# Patient Record
Sex: Female | Born: 1950 | Race: White | Hispanic: No | Marital: Married | Smoking: Never smoker
Health system: Southern US, Community
[De-identification: ages and names within clinical notes are randomized; demographics above are authoritative.]

## PROBLEM LIST (undated history)

## (undated) DIAGNOSIS — R079 Chest pain, unspecified: Secondary | ICD-10-CM

## (undated) DIAGNOSIS — IMO0001 Reserved for inherently not codable concepts without codable children: Secondary | ICD-10-CM

## (undated) DIAGNOSIS — F32A Depression, unspecified: Secondary | ICD-10-CM

## (undated) DIAGNOSIS — F329 Major depressive disorder, single episode, unspecified: Secondary | ICD-10-CM

## (undated) DIAGNOSIS — I10 Essential (primary) hypertension: Secondary | ICD-10-CM

## (undated) HISTORY — DX: Major depressive disorder, single episode, unspecified: F32.9

## (undated) HISTORY — DX: Essential (primary) hypertension: I10

## (undated) HISTORY — DX: Chest pain, unspecified: R07.9

## (undated) HISTORY — DX: Depression, unspecified: F32.A

## (undated) HISTORY — DX: Reserved for inherently not codable concepts without codable children: IMO0001

## (undated) HISTORY — PX: SPINE SURGERY: SHX786

---

## 1998-12-31 ENCOUNTER — Encounter: Payer: Self-pay | Admitting: Internal Medicine

## 1998-12-31 ENCOUNTER — Ambulatory Visit (HOSPITAL_COMMUNITY): Admission: RE | Admit: 1998-12-31 | Discharge: 1998-12-31 | Payer: Self-pay | Admitting: Family Medicine

## 1999-01-16 ENCOUNTER — Encounter: Payer: Self-pay | Admitting: Internal Medicine

## 1999-01-16 ENCOUNTER — Ambulatory Visit (HOSPITAL_COMMUNITY): Admission: RE | Admit: 1999-01-16 | Discharge: 1999-01-16 | Payer: Self-pay | Admitting: Internal Medicine

## 1999-05-17 ENCOUNTER — Other Ambulatory Visit: Admission: RE | Admit: 1999-05-17 | Discharge: 1999-05-17 | Payer: Self-pay | Admitting: Internal Medicine

## 2000-08-03 ENCOUNTER — Other Ambulatory Visit: Admission: RE | Admit: 2000-08-03 | Discharge: 2000-08-03 | Payer: Self-pay | Admitting: Gynecology

## 2000-08-18 ENCOUNTER — Other Ambulatory Visit: Admission: RE | Admit: 2000-08-18 | Discharge: 2000-08-18 | Payer: Self-pay | Admitting: Gynecology

## 2000-08-18 ENCOUNTER — Encounter (INDEPENDENT_AMBULATORY_CARE_PROVIDER_SITE_OTHER): Payer: Self-pay | Admitting: Specialist

## 2001-09-10 ENCOUNTER — Other Ambulatory Visit: Admission: RE | Admit: 2001-09-10 | Discharge: 2001-09-10 | Payer: Self-pay | Admitting: Gynecology

## 2002-01-12 ENCOUNTER — Encounter: Payer: Self-pay | Admitting: Internal Medicine

## 2002-01-12 ENCOUNTER — Ambulatory Visit (HOSPITAL_COMMUNITY): Admission: RE | Admit: 2002-01-12 | Discharge: 2002-01-12 | Payer: Self-pay | Admitting: Internal Medicine

## 2002-09-12 ENCOUNTER — Other Ambulatory Visit: Admission: RE | Admit: 2002-09-12 | Discharge: 2002-09-12 | Payer: Self-pay | Admitting: Gynecology

## 2003-01-23 ENCOUNTER — Encounter: Payer: Self-pay | Admitting: Gynecology

## 2003-01-23 ENCOUNTER — Ambulatory Visit (HOSPITAL_COMMUNITY): Admission: RE | Admit: 2003-01-23 | Discharge: 2003-01-23 | Payer: Self-pay | Admitting: Gynecology

## 2003-09-26 ENCOUNTER — Other Ambulatory Visit: Admission: RE | Admit: 2003-09-26 | Discharge: 2003-09-26 | Payer: Self-pay | Admitting: *Deleted

## 2004-02-01 ENCOUNTER — Ambulatory Visit (HOSPITAL_COMMUNITY): Admission: RE | Admit: 2004-02-01 | Discharge: 2004-02-01 | Payer: Self-pay | Admitting: Gynecology

## 2004-03-14 ENCOUNTER — Inpatient Hospital Stay (HOSPITAL_BASED_OUTPATIENT_CLINIC_OR_DEPARTMENT_OTHER): Admission: RE | Admit: 2004-03-14 | Discharge: 2004-03-14 | Payer: Self-pay | Admitting: Cardiology

## 2004-04-23 ENCOUNTER — Ambulatory Visit (HOSPITAL_COMMUNITY): Admission: RE | Admit: 2004-04-23 | Discharge: 2004-04-23 | Payer: Self-pay | Admitting: Gastroenterology

## 2004-10-02 ENCOUNTER — Other Ambulatory Visit: Admission: RE | Admit: 2004-10-02 | Discharge: 2004-10-02 | Payer: Self-pay | Admitting: Gynecology

## 2005-02-04 ENCOUNTER — Ambulatory Visit (HOSPITAL_COMMUNITY): Admission: RE | Admit: 2005-02-04 | Discharge: 2005-02-04 | Payer: Self-pay | Admitting: Family Medicine

## 2005-02-19 ENCOUNTER — Encounter: Admission: RE | Admit: 2005-02-19 | Discharge: 2005-02-19 | Payer: Self-pay | Admitting: Family Medicine

## 2005-10-06 ENCOUNTER — Other Ambulatory Visit: Admission: RE | Admit: 2005-10-06 | Discharge: 2005-10-06 | Payer: Self-pay | Admitting: Gynecology

## 2006-02-12 ENCOUNTER — Ambulatory Visit (HOSPITAL_COMMUNITY): Admission: RE | Admit: 2006-02-12 | Discharge: 2006-02-12 | Payer: Self-pay | Admitting: Gynecology

## 2006-10-08 ENCOUNTER — Other Ambulatory Visit: Admission: RE | Admit: 2006-10-08 | Discharge: 2006-10-08 | Payer: Self-pay | Admitting: Gynecology

## 2007-02-16 ENCOUNTER — Ambulatory Visit (HOSPITAL_COMMUNITY): Admission: RE | Admit: 2007-02-16 | Discharge: 2007-02-16 | Payer: Self-pay | Admitting: Gynecology

## 2007-10-11 ENCOUNTER — Other Ambulatory Visit: Admission: RE | Admit: 2007-10-11 | Discharge: 2007-10-11 | Payer: Self-pay | Admitting: Gynecology

## 2008-02-17 ENCOUNTER — Ambulatory Visit (HOSPITAL_COMMUNITY): Admission: RE | Admit: 2008-02-17 | Discharge: 2008-02-17 | Payer: Self-pay | Admitting: Gynecology

## 2008-04-18 ENCOUNTER — Emergency Department (HOSPITAL_COMMUNITY): Admission: EM | Admit: 2008-04-18 | Discharge: 2008-04-18 | Payer: Self-pay | Admitting: Emergency Medicine

## 2009-02-19 ENCOUNTER — Ambulatory Visit (HOSPITAL_COMMUNITY): Admission: RE | Admit: 2009-02-19 | Discharge: 2009-02-19 | Payer: Self-pay | Admitting: Gynecology

## 2009-03-30 IMAGING — CR DG CHEST 2V
2 series · 2 of 2 positions shown · non-contrast
Comparison: None

CLINICAL DATA: Chest pain radiating to left arm.

CHEST - 2 VIEW

[w chest pa]
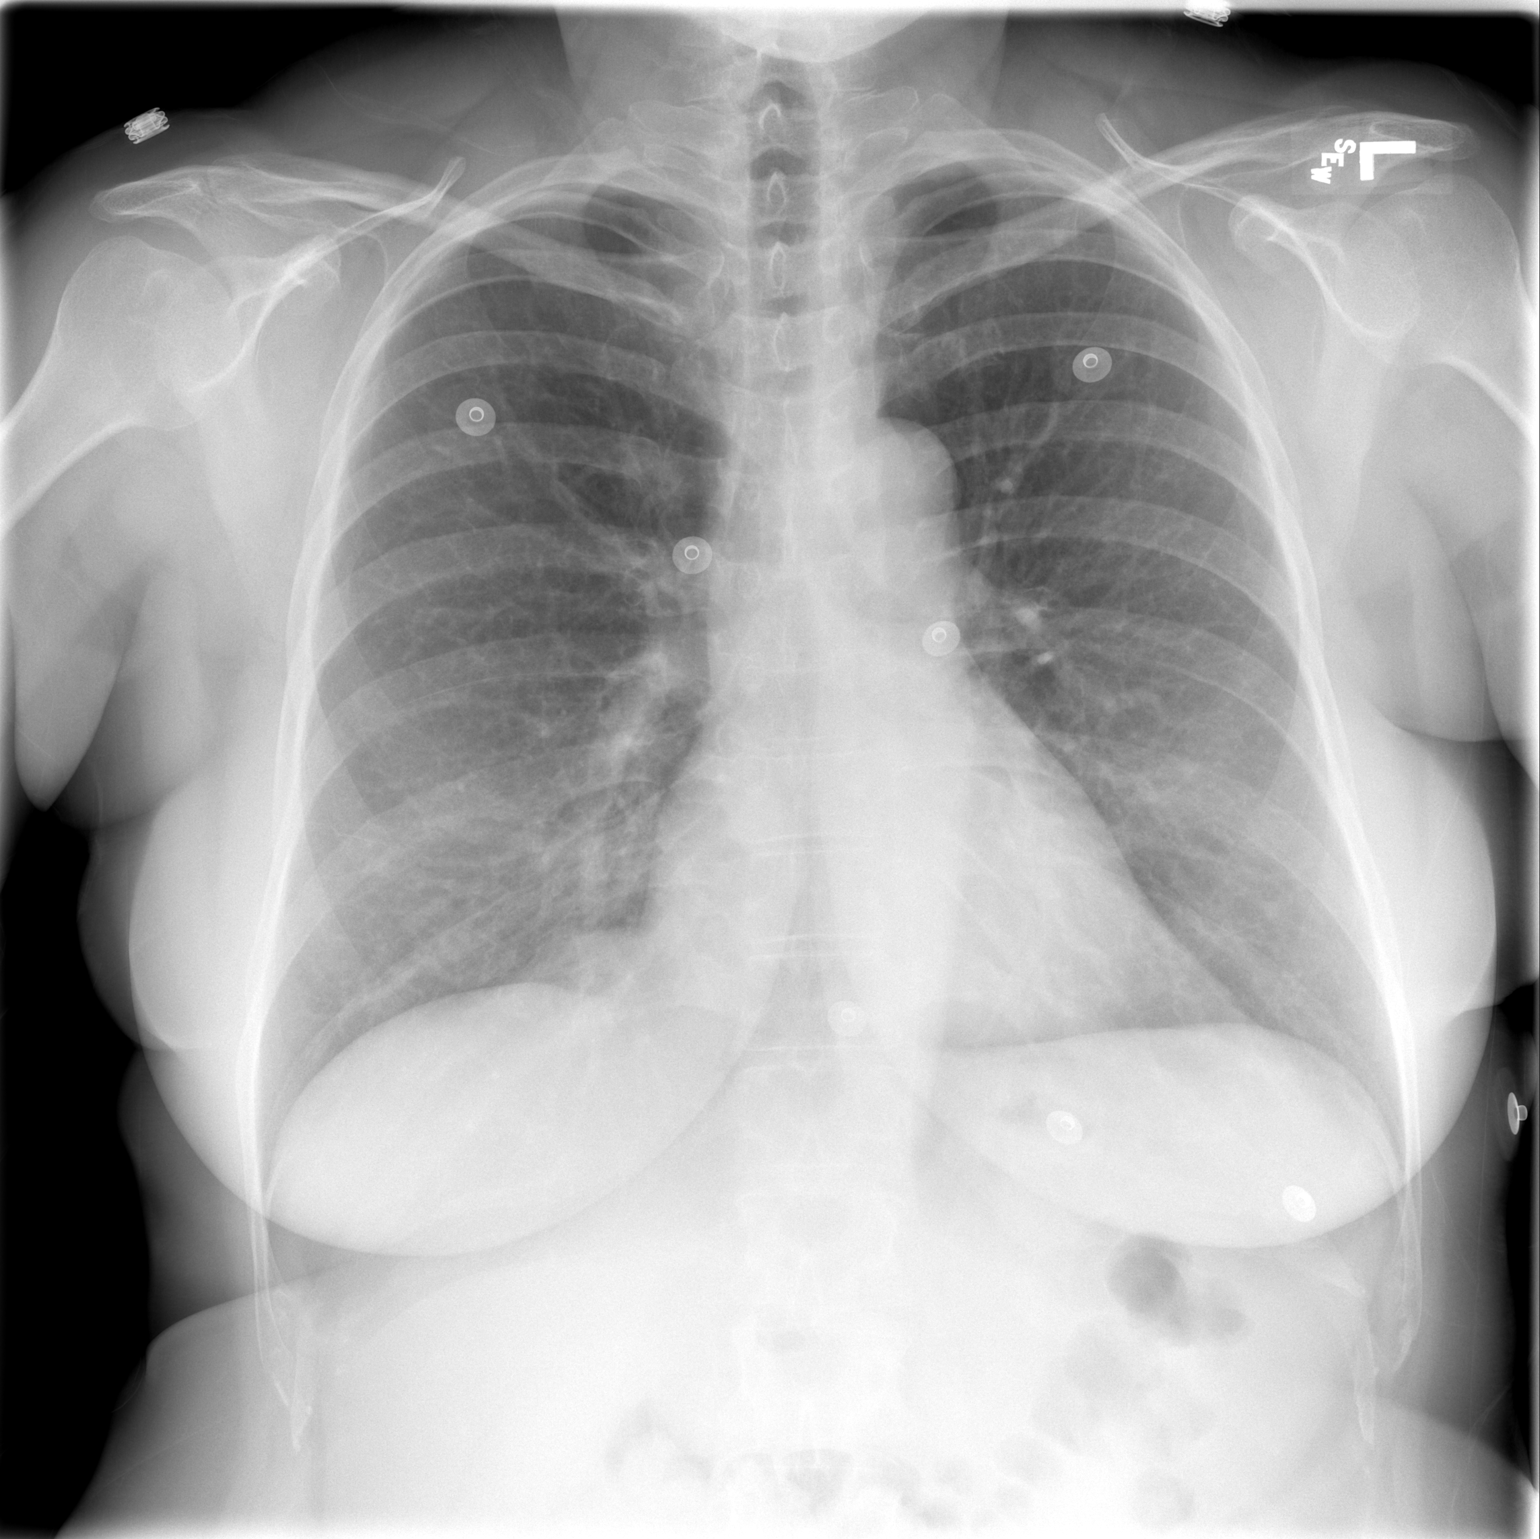

[w chest lat]
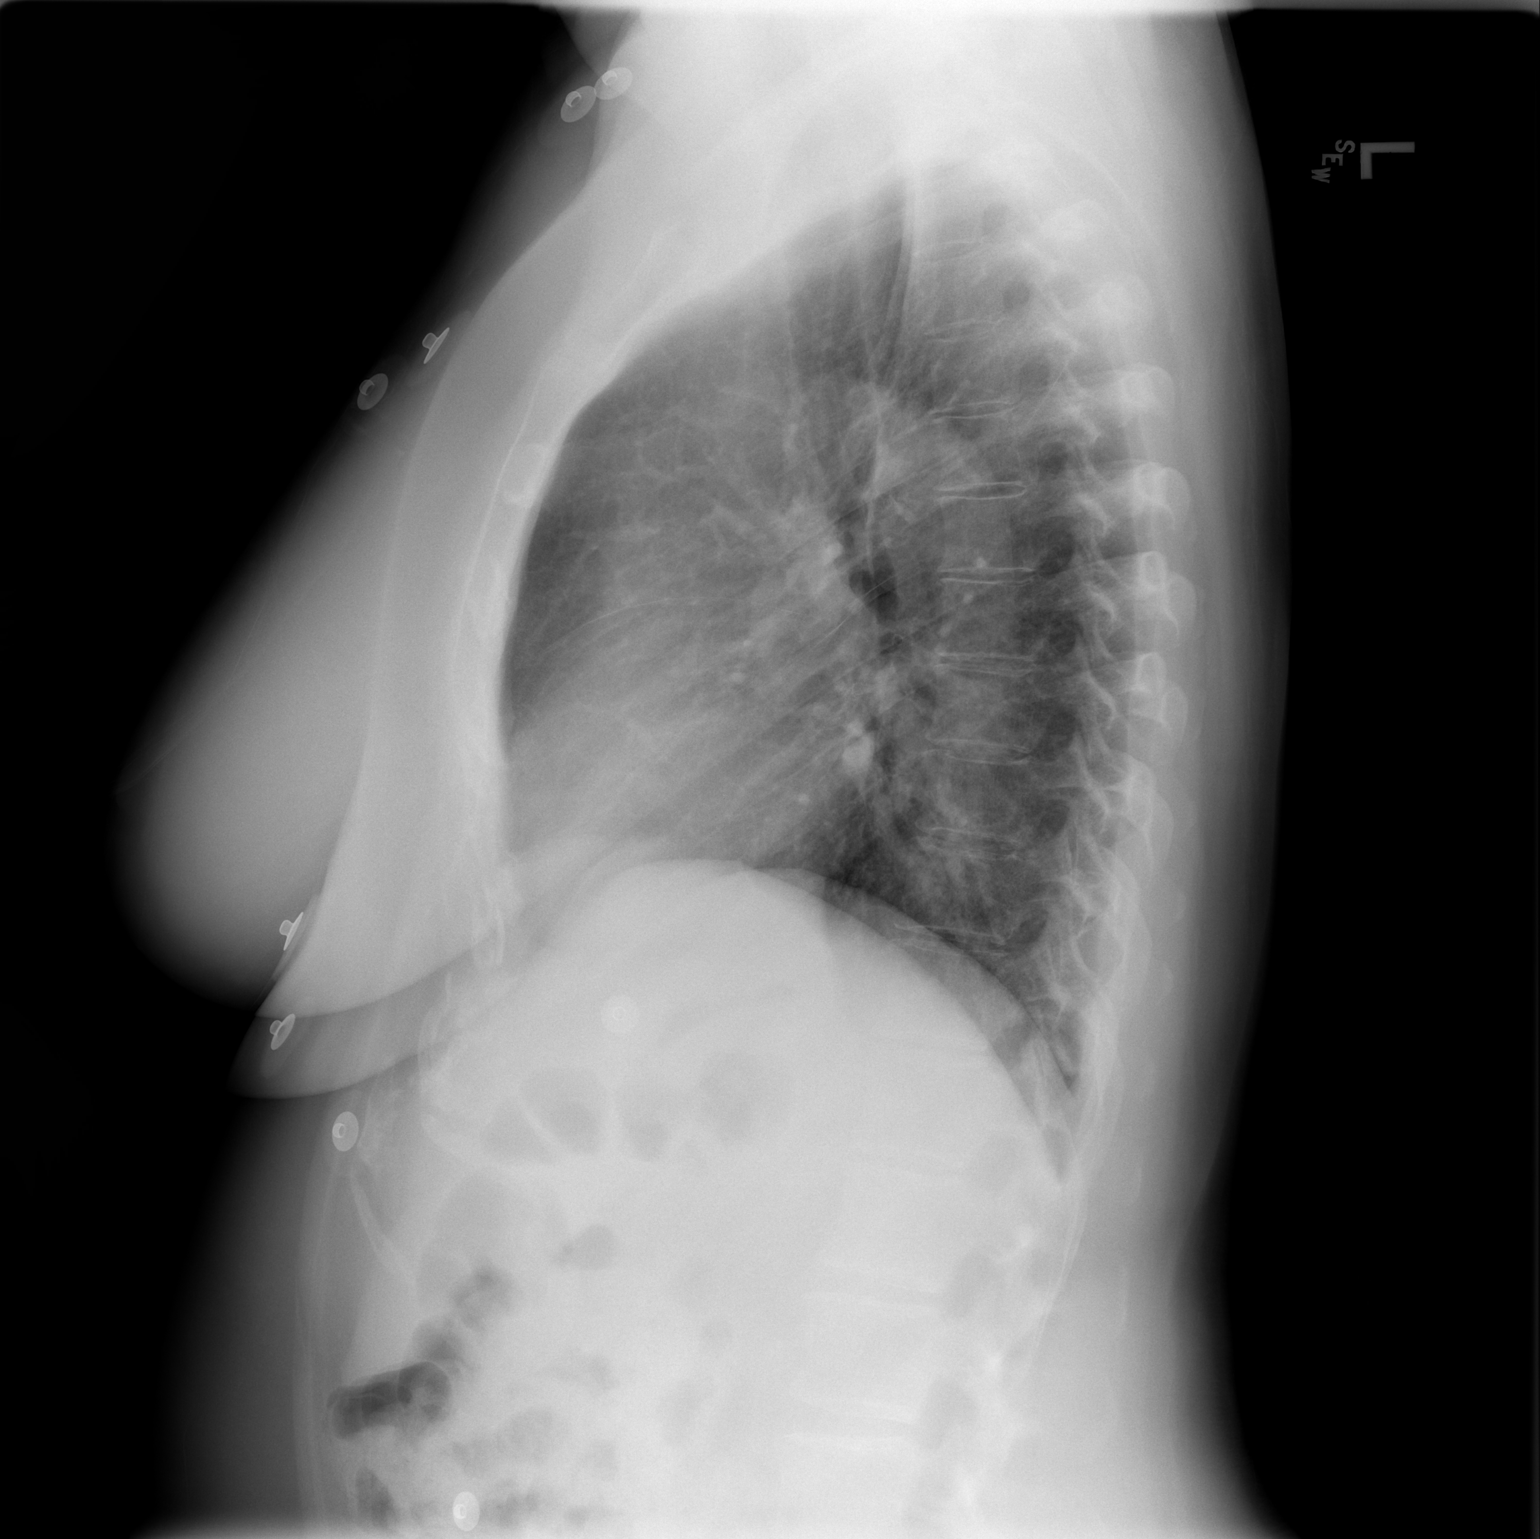

[2 of 2 positions shown; findings below may reference images not displayed]

FINDINGS: The heart size and mediastinal contours are within normal
limits.  Both lungs are clear.  The visualized skeletal structures
are unremarkable.
IMPRESSION: No active cardiopulmonary disease.

## 2010-02-20 ENCOUNTER — Ambulatory Visit (HOSPITAL_COMMUNITY): Admission: RE | Admit: 2010-02-20 | Discharge: 2010-02-20 | Payer: Self-pay | Admitting: Gynecology

## 2010-08-20 ENCOUNTER — Ambulatory Visit (HOSPITAL_COMMUNITY): Admission: RE | Admit: 2010-08-20 | Discharge: 2010-08-20 | Payer: Self-pay | Admitting: Neurosurgery

## 2010-11-10 ENCOUNTER — Encounter: Payer: Self-pay | Admitting: Gynecology

## 2011-01-01 LAB — BASIC METABOLIC PANEL
BUN: 14 mg/dL (ref 6–23)
CO2: 29 mEq/L (ref 19–32)
Chloride: 98 mEq/L (ref 96–112)
Creatinine, Ser: 0.89 mg/dL (ref 0.4–1.2)
Potassium: 4.5 mEq/L (ref 3.5–5.1)

## 2011-01-01 LAB — CBC
HCT: 44.8 % (ref 36.0–46.0)
Hemoglobin: 15.3 g/dL — ABNORMAL HIGH (ref 12.0–15.0)
MCH: 30.8 pg (ref 26.0–34.0)
MCHC: 34.2 g/dL (ref 30.0–36.0)
MCV: 90.3 fL (ref 78.0–100.0)
RBC: 4.96 MIL/uL (ref 3.87–5.11)

## 2011-01-29 ENCOUNTER — Other Ambulatory Visit (HOSPITAL_COMMUNITY): Payer: Self-pay | Admitting: Family Medicine

## 2011-01-29 DIAGNOSIS — Z1231 Encounter for screening mammogram for malignant neoplasm of breast: Secondary | ICD-10-CM

## 2011-02-24 ENCOUNTER — Ambulatory Visit (HOSPITAL_COMMUNITY)
Admission: RE | Admit: 2011-02-24 | Discharge: 2011-02-24 | Disposition: A | Discharge status: Home / Self Care | Payer: 59 | Source: Ambulatory Visit | Attending: Family Medicine | Admitting: Family Medicine

## 2011-02-24 DIAGNOSIS — Z1231 Encounter for screening mammogram for malignant neoplasm of breast: Secondary | ICD-10-CM | POA: Insufficient documentation

## 2011-03-07 NOTE — Op Note (Signed)
NAME:  Laura [name], Laura [name]                          ACCOUNT NO.:  [id number]   MEDICAL RECORD NO.:  [id number]                   PATIENT TYPE:  AMB   LOCATION:  ENDO                                 FACILITY:  [facility]   PHYSICIAN:  James L. Malon Kindle., M.D.          DATE OF BIRTH:  1950-12-26   DATE OF PROCEDURE:  04/23/2004  DATE OF DISCHARGE:                                 OPERATIVE REPORT   PROCEDURE:  Colonoscopy.   MEDICATIONS:  Fentanyl 75 mcg, Versed 6 mg IV.   SCOPE:  Olympus pediatric adjustable colonoscope.   INDICATIONS FOR PROCEDURE:  Family history of colon polyps.   DESCRIPTION OF PROCEDURE:  The procedure had been explained to the patient  and consent obtained. With the patient in the left lateral decubitus  position, the Olympus scope was inserted and advanced. The prep was  excellent. We were able to advance around to the cecum, the ileocecal valve  and appendiceal orifice were seen. The scope was withdrawn and the cecum,  ascending colon, transverse colon, splenic flexure, descending and sigmoid  colon were seen well, there were no polyps seen and no significant  diverticular disease. The rectum was seen well, there were no polyps in the  rectum.  The scope was withdrawn. The patient tolerated the procedure well.   ASSESSMENT:  Normal colonoscopy in a woman with a strong family history of  colon polyps, V19.8.   PLAN:  Will recommend yearly Hemoccults and repeat colonoscopy in five  years.                                               James L. Malon Kindle., M.D.    Waldron Session  D:  04/23/2004  T:  04/23/2004  Job:  [id number]   cc:   Clydie Braun L. Hal Hope, M.D.  [address] [place]  [place] [address]  Fax: [phone]

## 2011-03-07 NOTE — Cardiovascular Report (Signed)
NAME:  Laura [name], Laura [name]                          ACCOUNT NO.:  [id number]   MEDICAL RECORD NO.:  [id number]                   PATIENT TYPE:  OIB   LOCATION:  6501                                 FACILITY:  [facility]   PHYSICIAN:  Rollene Rotunda, M.D.                DATE OF BIRTH:  1951/05/21   DATE OF PROCEDURE:  03/14/2004  DATE OF DISCHARGE:  03/14/2004                              CARDIAC CATHETERIZATION   PRIMARY CARE PHYSICIAN:  Clydie Braun L. Hal Hope, M.D. , [facility]   PROCEDURES PERFORMED:  1. Left heart catheterization.  2. Coronary arteriography.   CARDIOLOGIST:  Rollene Rotunda, M.D.   INDICATIONS:  Evaluate patient with chest pain and a Cardiolite  demonstrating ST segment depression in the inferolateral leads, and  questionable mild anteroapical ischemia.   PROCEDURAL NOTE:  Left heart catheterization was performed via the right  femoral artery.  The artery was cannulated using anterior wall puncture.  Number four French arterial sheath was inserted via the modified Seldinger  technique.  Preformed Judkins and a pigtail catheter were utilized.   The patient tolerated the procedure and left the lab in stable condition.   RESULTS:   HEMODYNAMIC DATA:  LV 143/13.  Aortic output 148/72.   ANGIOGRAPHIC DATA:  Coronaries  Left Main:  The left main was normal.   Left Anterior Descending:  The LAD was normal.  There was a small first  diagonal, which was normal.   Circumflex:  The circumflex was normal and the AV groove.  There was a small  OM-1, which was normal.  There was a large OM-2, which was normal.   Right Coronary Artery:  The right coronary artery was dominant. There was a  PDA and one posterolateral.  There were luminal irregularities in the distal  RCA.   VENTRICULOGRAPHIC DATA:  Left Ventriculogram:  The left ventriculogram was  obtained in the RAO projection.  The EF was 65% with normal wall motion.   CONCLUSION:  1. Minimal coronary  plaque.  2. Normal left ventricular function.   PLAN:  1. No further cardiac workup.  2. The patient will follow up with Dr. Hal Hope for evaluation of nonanginal     chest discomfort.                                               Rollene Rotunda, M.D.    JH/MEDQ  D:  03/14/2004  T:  03/15/2004  Job:  [id number]   cc:   Clydie Braun L. Hal Hope, M.D.  [facility]

## 2011-07-17 LAB — POCT I-STAT, CHEM 8
BUN: 16
Calcium, Ion: 1.2
Chloride: 105
Creatinine, Ser: 1
HCT: 44
Hemoglobin: 15
Potassium: 3.9
Sodium: 141

## 2011-07-17 LAB — CBC
HCT: 42
Hemoglobin: 14.3
MCHC: 34.1
RBC: 4.63
RDW: 12.9

## 2011-07-17 LAB — DIFFERENTIAL
Basophils Absolute: 0.1
Basophils Relative: 1
Eosinophils Absolute: 0.1
Monocytes Absolute: 0.9
Monocytes Relative: 11

## 2011-07-17 LAB — POCT CARDIAC MARKERS: CKMB, poc: 1.1

## 2011-07-31 IMAGING — CR DG CHEST 2V
2 series · 2 of 2 positions shown · non-contrast
Comparison: Two-view chest x-ray 04/18/2008.

CLINICAL DATA: Lumbar disc herniation.  Preoperative respiratory
evaluation prior lumbar surgery.

CHEST - 2 VIEW 08/19/2010:

[view not recorded (1 of 2)]
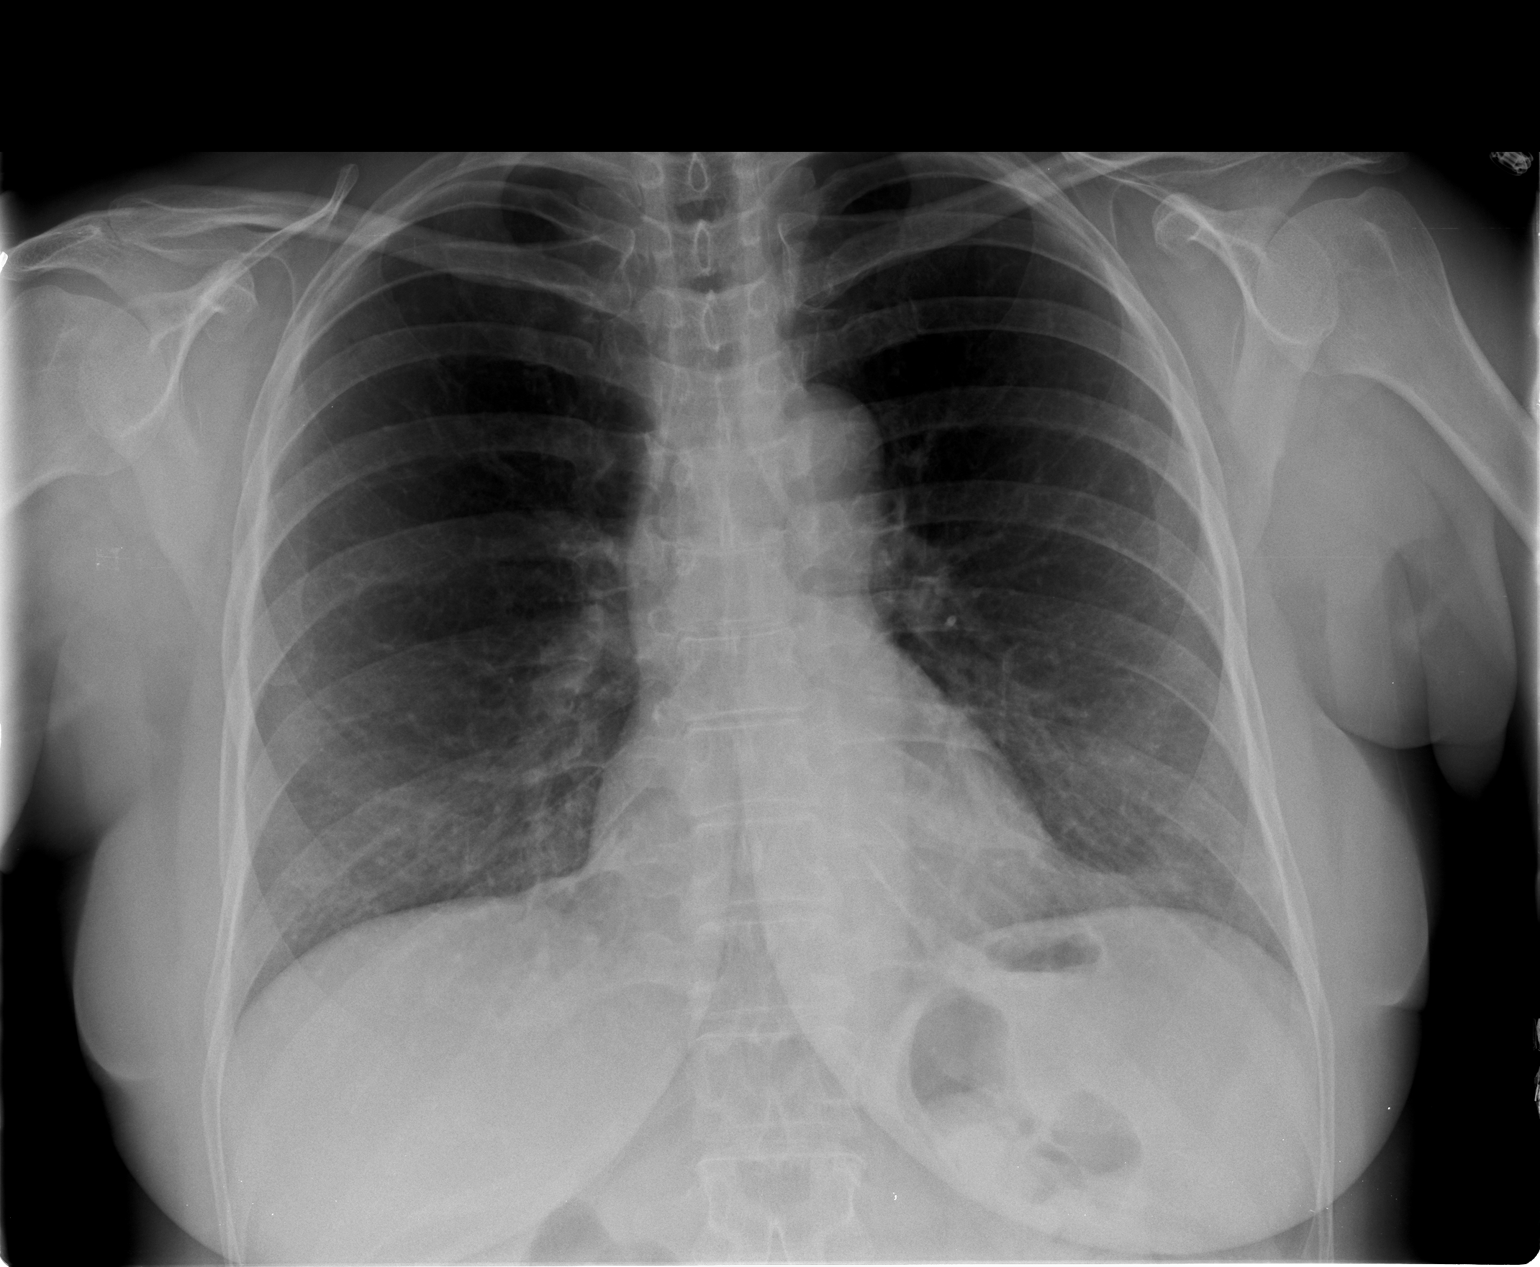

[view not recorded (2 of 2)]
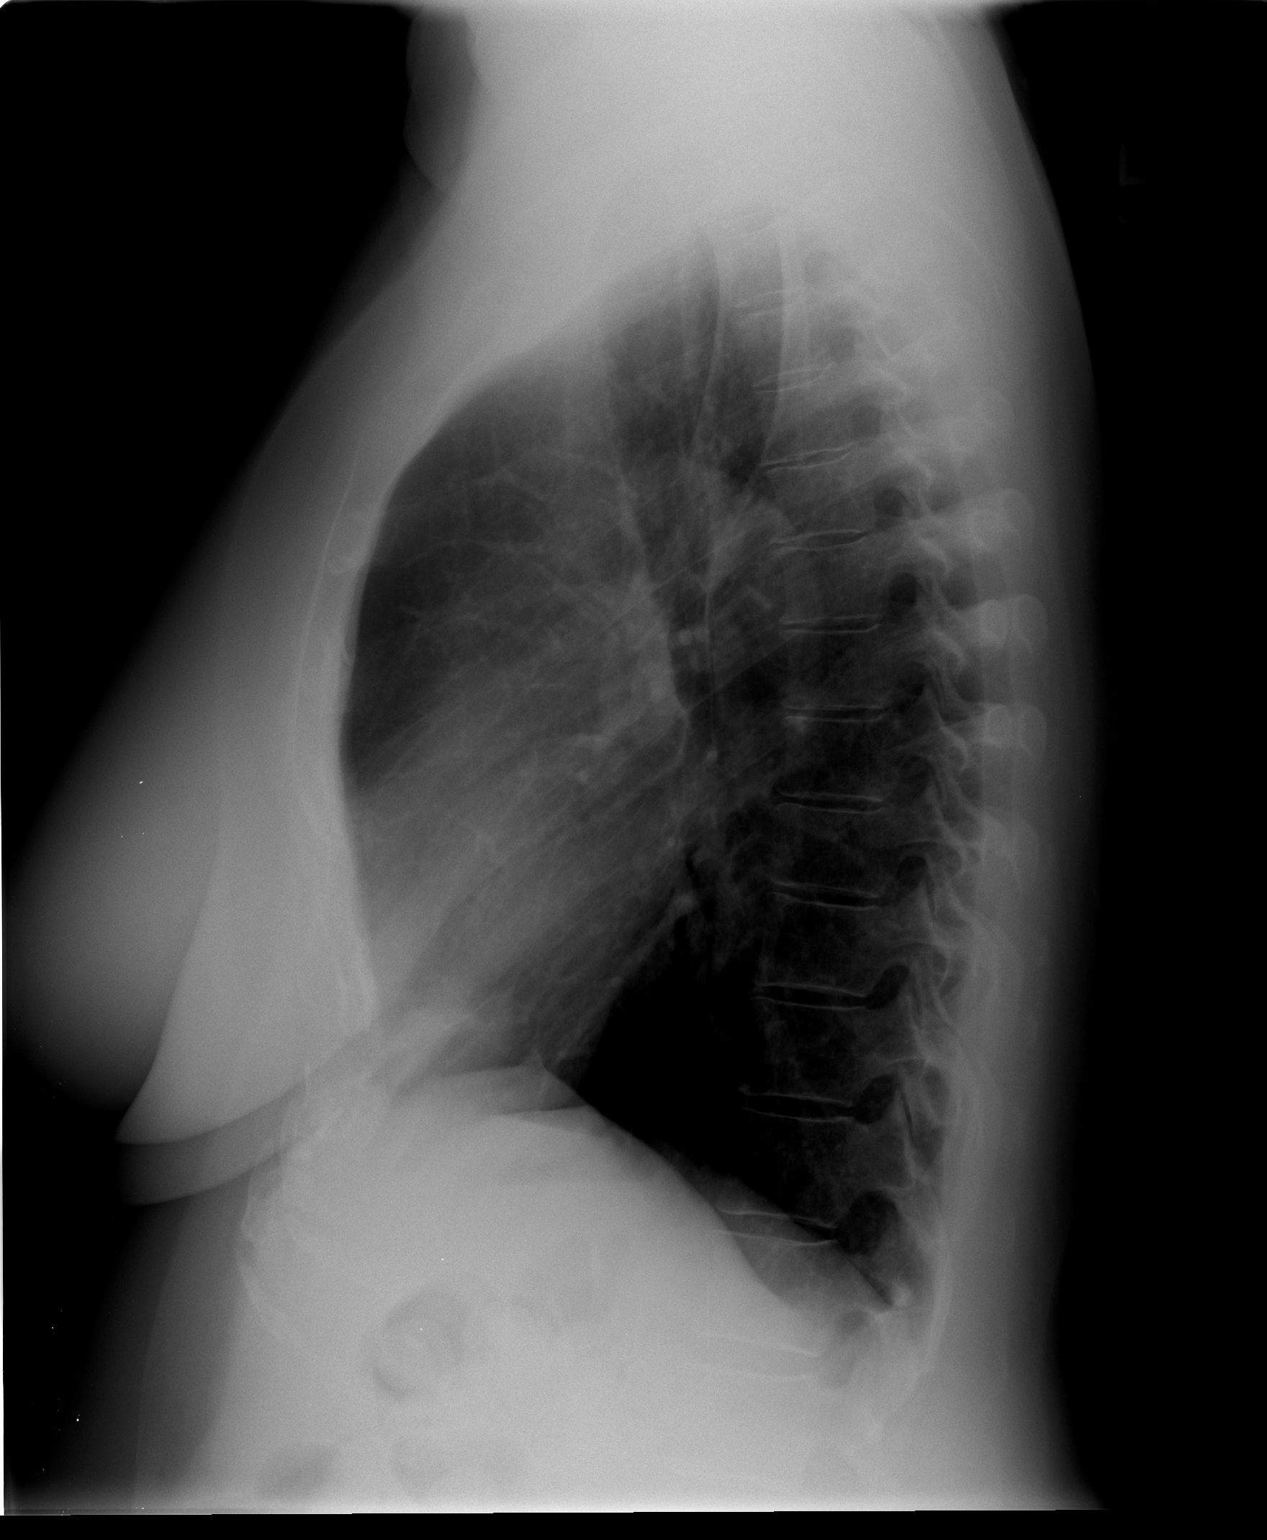

[2 of 2 positions shown; findings below may reference images not displayed]

FINDINGS: Cardiomediastinal silhouette unremarkable and unchanged.
Lungs clear.  Pulmonary vascularity normal.  Bronchovascular
markings normal.  No pleural effusions.  Prominent paracardiac fat
pad on the right.  Visualized bony thorax intact.  No significant
interval change.
IMPRESSION: No acute cardiopulmonary disease.  Stable chest x-ray.

## 2011-08-01 IMAGING — CR DG LUMBAR SPINE 1V
1 series · 1 of 1 positions shown · non-contrast
Comparison: None

CLINICAL DATA: L4-5 microdiskectomy.

LUMBAR SPINE - 1 VIEW

[view not recorded]
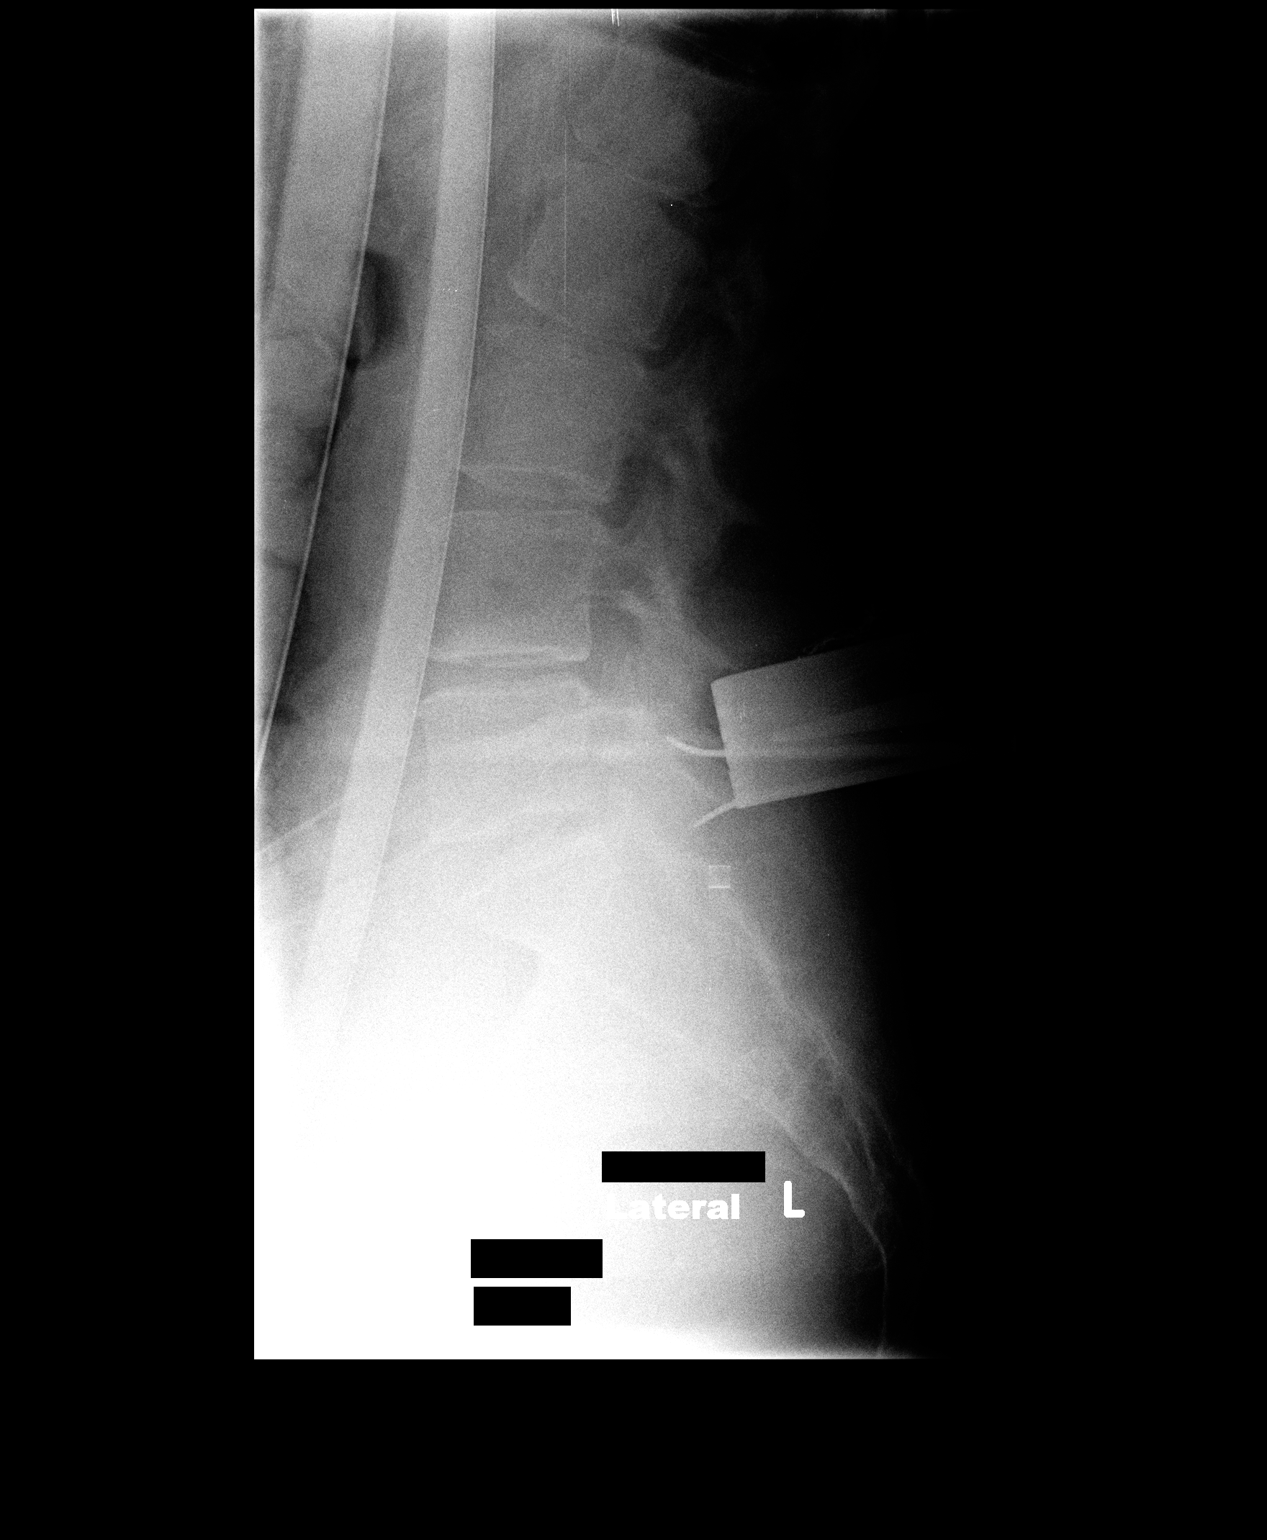

[1 of 1 positions shown; findings below may reference images not displayed]

FINDINGS: Single lateral intraoperative image demonstrates
posterior surgical instruments directed at the L5 S1 level.
IMPRESSION: Intraoperative localization as above.

## 2011-12-04 ENCOUNTER — Other Ambulatory Visit: Payer: Self-pay | Admitting: Obstetrics and Gynecology

## 2011-12-04 DIAGNOSIS — N644 Mastodynia: Secondary | ICD-10-CM

## 2011-12-11 ENCOUNTER — Ambulatory Visit
Admission: RE | Admit: 2011-12-11 | Discharge: 2011-12-11 | Disposition: A | Discharge status: Home / Self Care | Payer: 59 | Source: Ambulatory Visit | Attending: Obstetrics and Gynecology | Admitting: Obstetrics and Gynecology

## 2011-12-11 ENCOUNTER — Other Ambulatory Visit: Payer: Self-pay | Admitting: Obstetrics and Gynecology

## 2011-12-11 DIAGNOSIS — N644 Mastodynia: Secondary | ICD-10-CM

## 2012-12-14 ENCOUNTER — Other Ambulatory Visit: Payer: Self-pay | Admitting: Gastroenterology

## 2012-12-14 ENCOUNTER — Ambulatory Visit
Admission: RE | Admit: 2012-12-14 | Discharge: 2012-12-14 | Disposition: A | Discharge status: Home / Self Care | Payer: 59 | Source: Ambulatory Visit | Attending: Gastroenterology | Admitting: Gastroenterology

## 2012-12-14 DIAGNOSIS — R1310 Dysphagia, unspecified: Secondary | ICD-10-CM

## 2013-04-11 ENCOUNTER — Other Ambulatory Visit: Payer: Self-pay

## 2013-04-11 DIAGNOSIS — Z1231 Encounter for screening mammogram for malignant neoplasm of breast: Secondary | ICD-10-CM

## 2013-04-14 ENCOUNTER — Ambulatory Visit: Payer: 59

## 2013-04-19 ENCOUNTER — Ambulatory Visit
Admission: RE | Admit: 2013-04-19 | Discharge: 2013-04-19 | Disposition: A | Discharge status: Home / Self Care | Payer: 59 | Source: Ambulatory Visit

## 2013-04-19 DIAGNOSIS — Z1231 Encounter for screening mammogram for malignant neoplasm of breast: Secondary | ICD-10-CM

## 2013-11-25 IMAGING — RF DG ESOPHAGUS
12 of 15 series · 19 of 24 positions shown · non-contrast
Comparison: None.

CLINICAL DATA: Dysphagia

ESOPHOGRAM/BARIUM SWALLOW
TECHNIQUE: Combined double contrast and single contrast
examination performed using effervescent crystals, thick barium
liquid, and thin barium liquid.
Fluoroscopy time:  1.8 minutes.

[Series 2: run · 1 of 1 slices shown (1 of 12)]
[im 1/1]
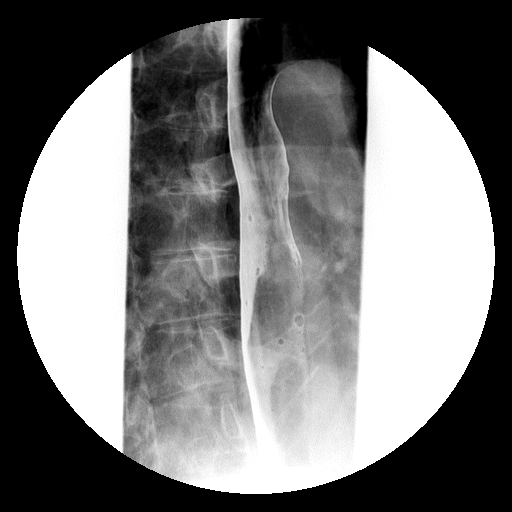

[Series 3: run · 1 of 1 slices shown (2 of 12)]
[im 1/1]
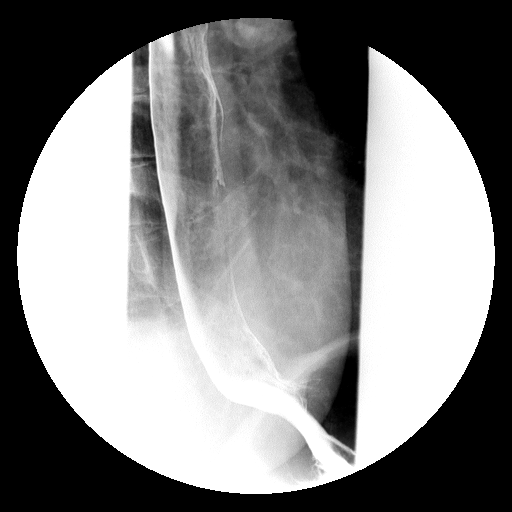

[Series 5: run · 1 of 1 slices shown (3 of 12)]
[im 1/1]
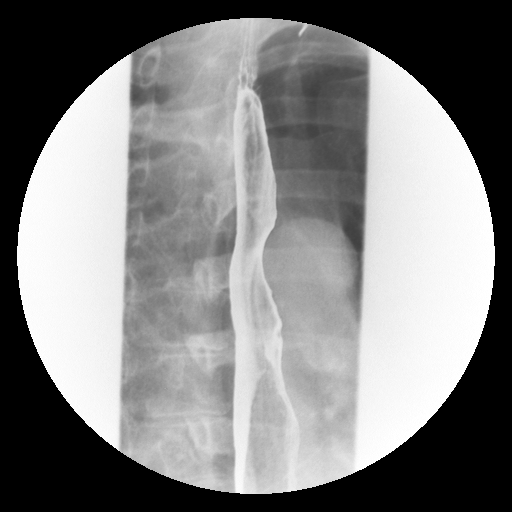

[Series 6: run · 5 of 9 slices shown (4 of 12)]
[im 1/9]
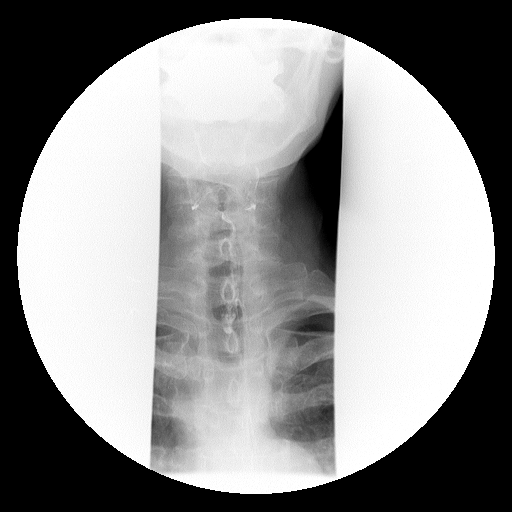
[im 2/9]
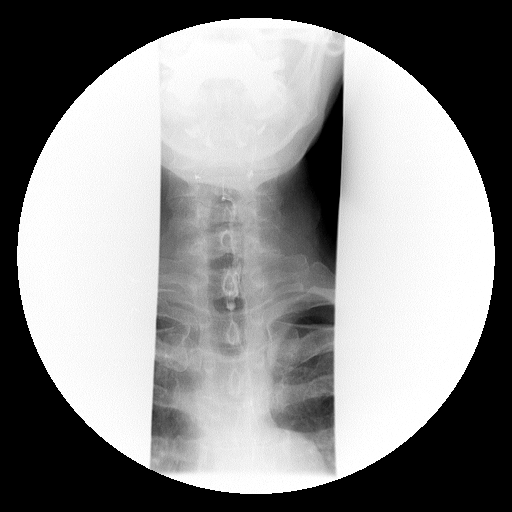
[im 4/9]
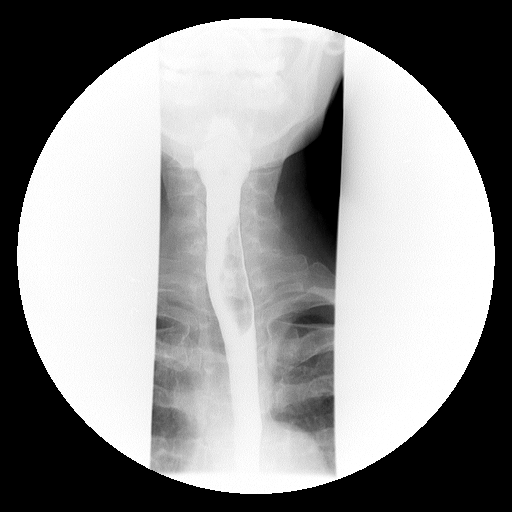
[im 7/9]
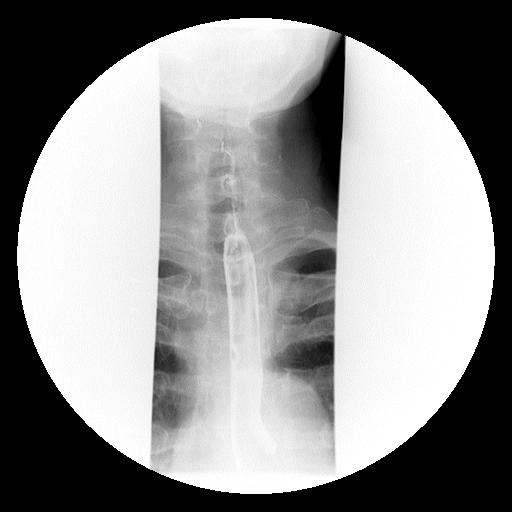
[im 9/9]
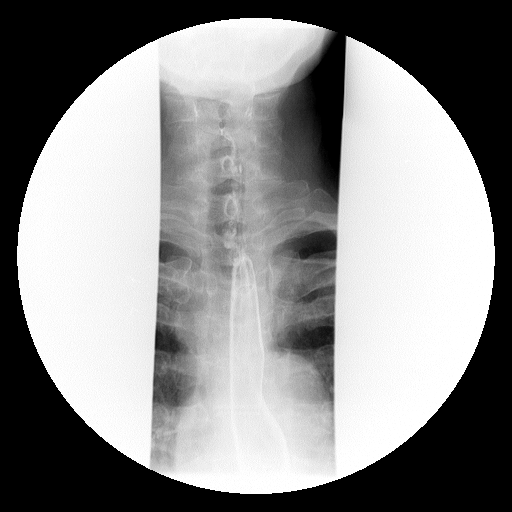

[Series 7: run · 4 of 8 slices shown (5 of 12)]
[im 1/8]
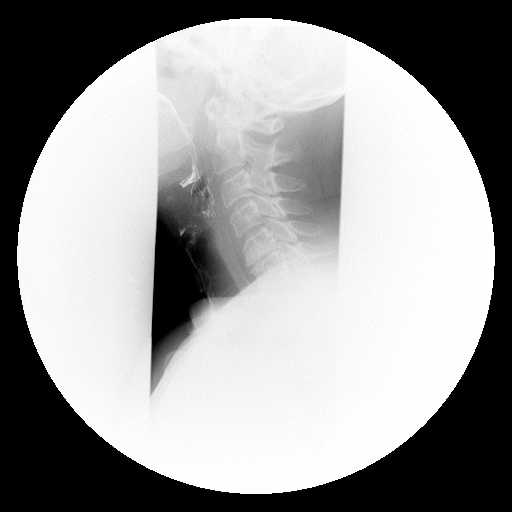
[im 4/8]
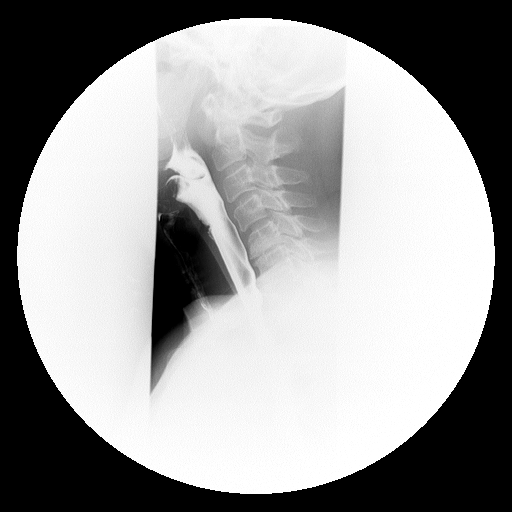
[im 6/8]
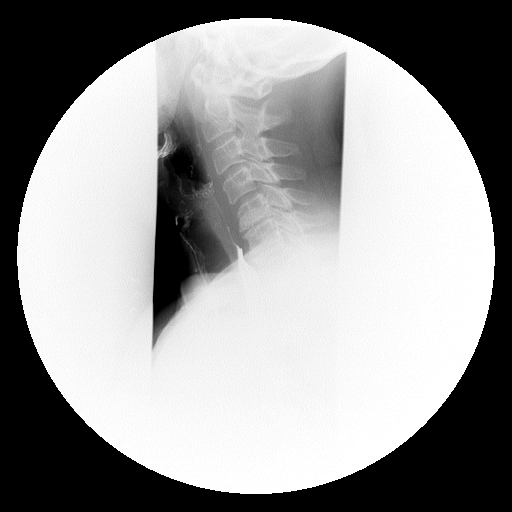
[im 8/8]
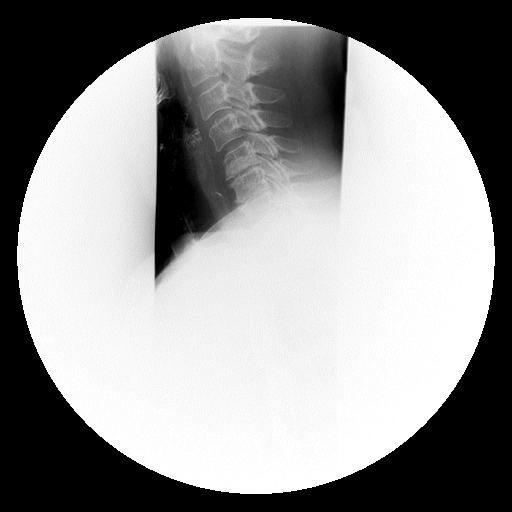

[Series 8: run · 1 of 1 slices shown (6 of 12)]
[im 1/1]
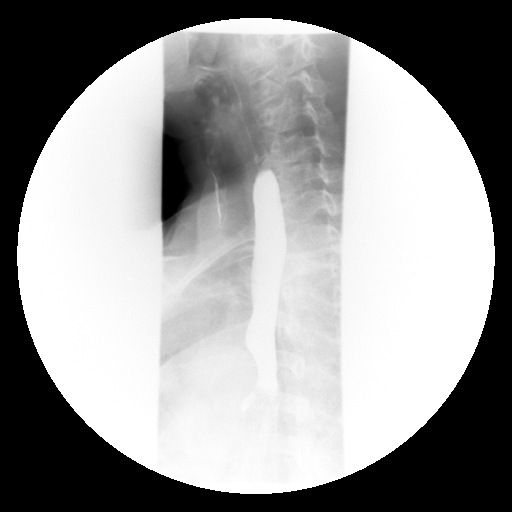

[Series 10: run · 1 of 1 slices shown (7 of 12)]
[im 1/1]
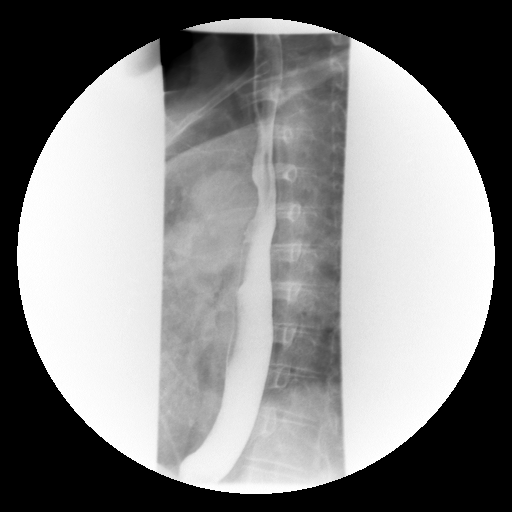

[Series 11: run · 1 of 1 slices shown (8 of 12)]
[im 1/1]
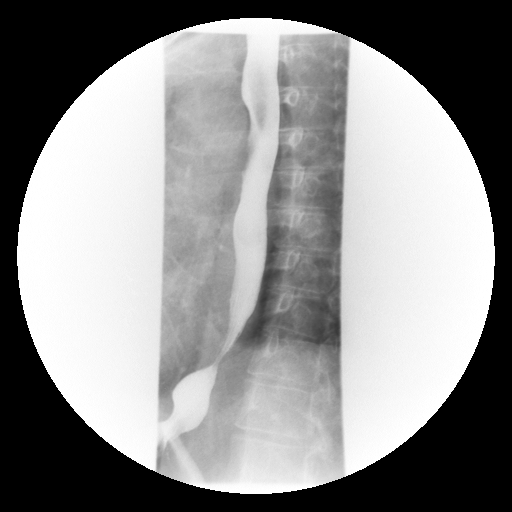

[Series 12: run · 1 of 1 slices shown (9 of 12)]
[im 1/1]
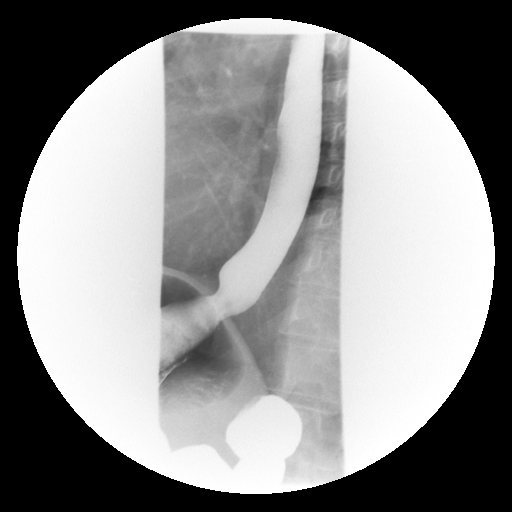

[Series 13: run · 1 of 1 slices shown (10 of 12)]
[im 1/1]
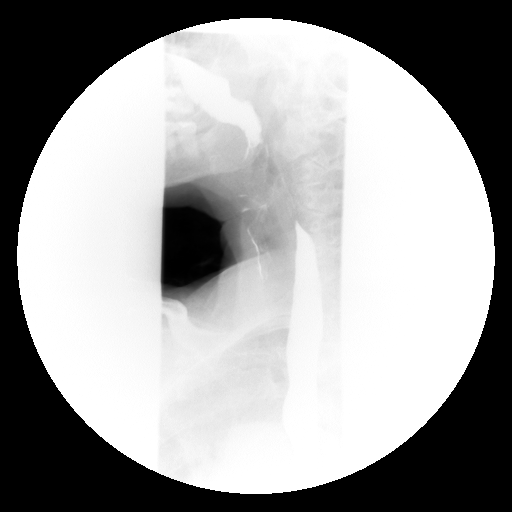

[Series 16: run · 1 of 1 slices shown (11 of 12)]
[im 1/1]
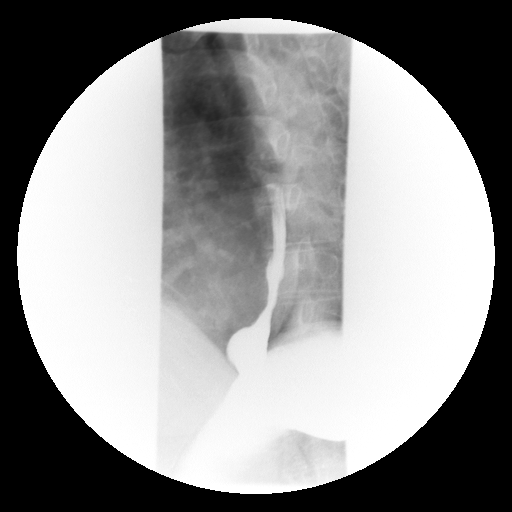

[Series 17: run · 1 of 1 slices shown (12 of 12)]
[im 1/1]
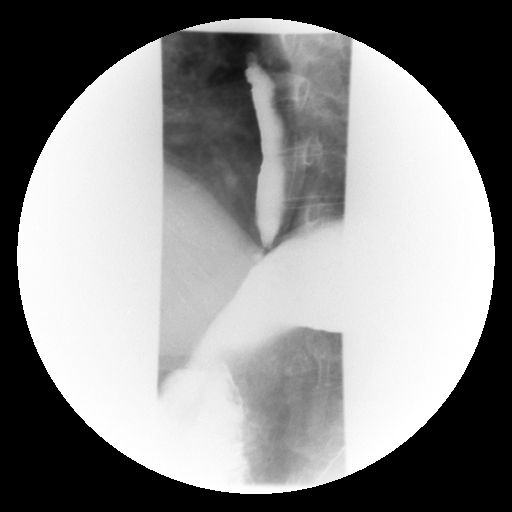

[19 of 24 positions shown; findings below may reference images not displayed]

FINDINGS: A double contrast study was performed.  The mucosa of
the esophagus is unremarkable.  A single contrast study shows the
swallowing mechanism to be unremarkable.  However, on the lateral
view there is suggestion of a faint penetration and frank
aspiration without cough response.  Degenerative disc disease is
noted at C5-6 and C6-7 levels.  Esophageal peristalsis is within
normal limits.  No hiatal hernia is seen.  However, there is
moderate gastroesophageal reflux demonstrated.  A barium pill was
given at the end of the study which did pass into the stomach
without delay.
IMPRESSION: 1.  Mild to moderate gastroesophageal reflux.  Barium pill passes
into the stomach without delay.
2.  Faint aspiration with no cough response.

## 2013-12-21 IMAGING — MG MM DIGITAL SCREENING BILAT
4 series · 4 of 4 positions shown · non-contrast
Comparison: Previous exams.

CLINICAL DATA: Screening.

DIGITAL SCREENING BILATERAL MAMMOGRAM WITH CAD

[R CC]
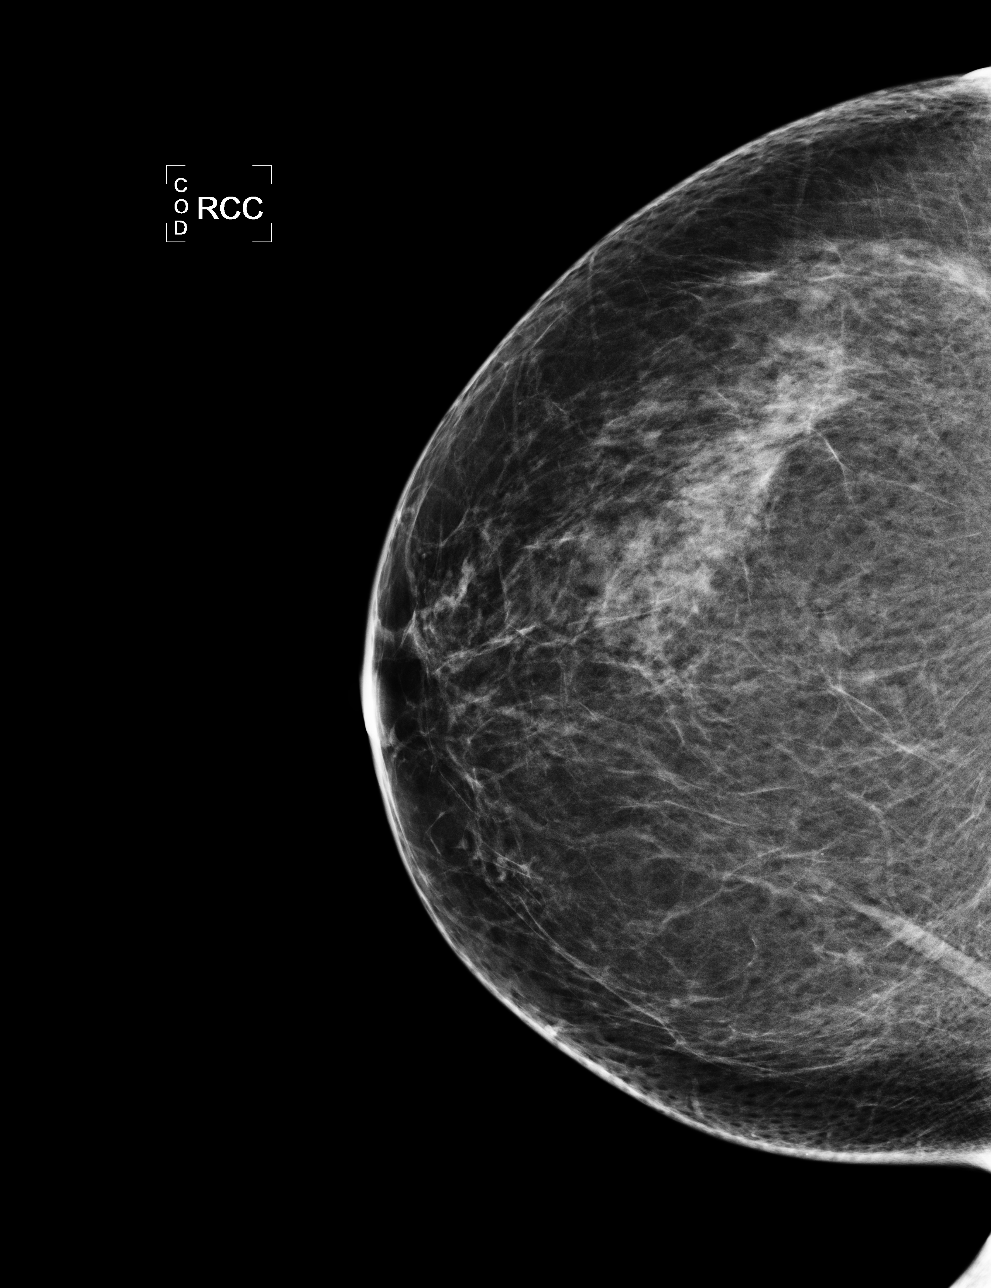

[L CC]
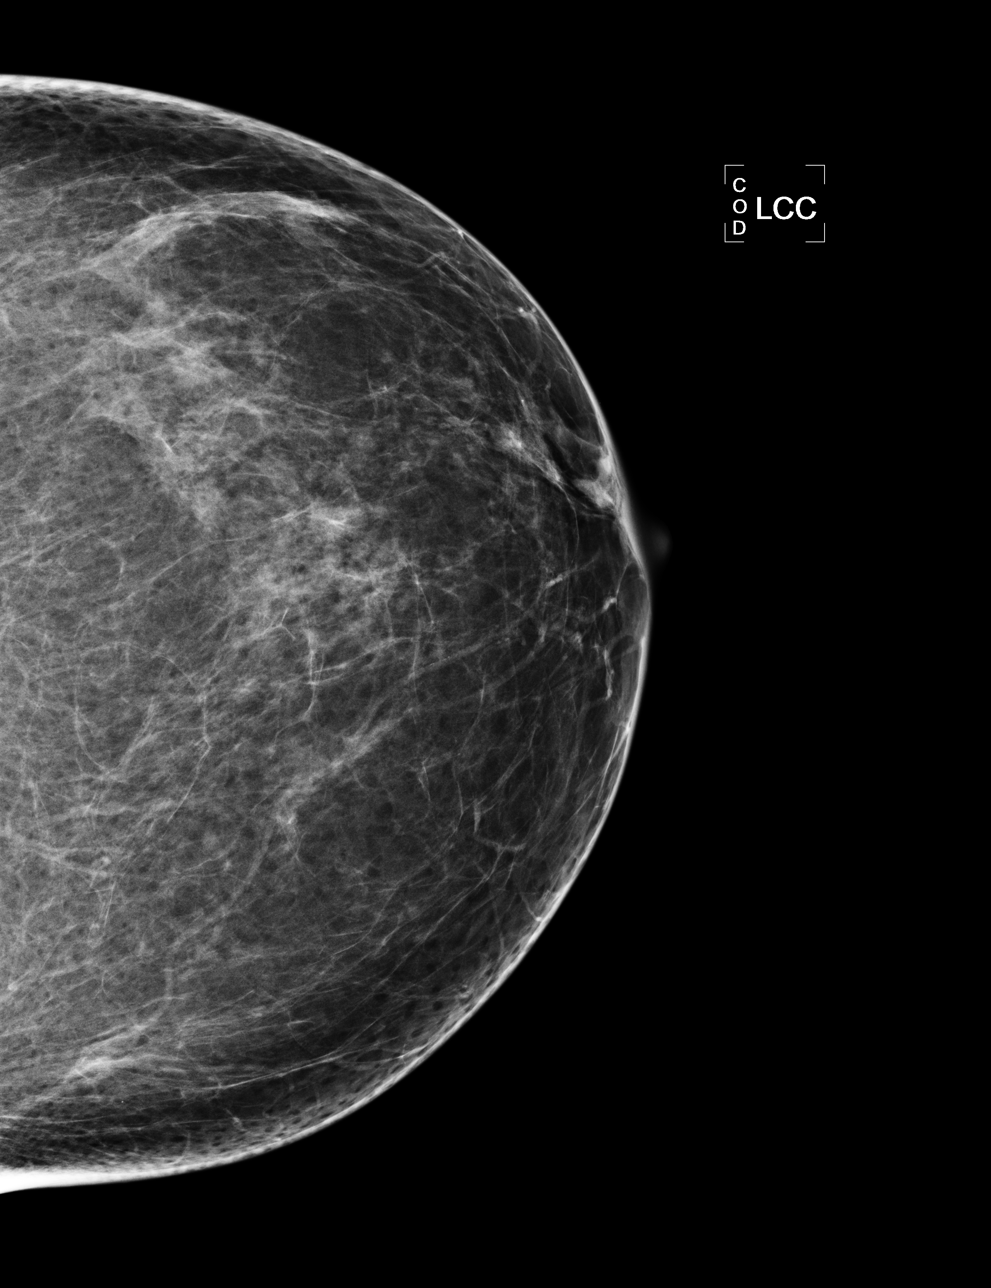

[L MLO]
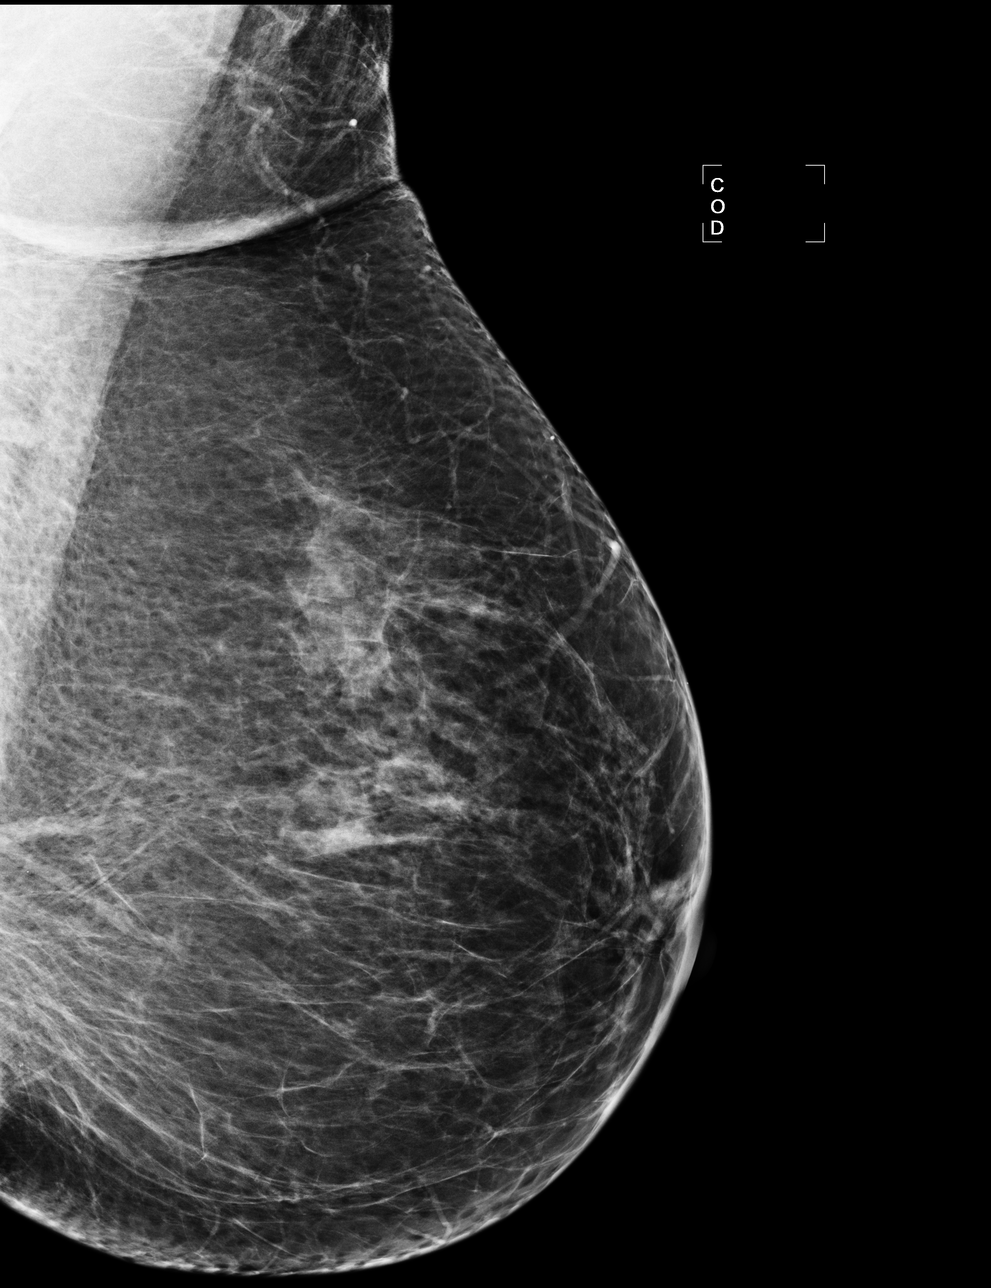

[R MLO]
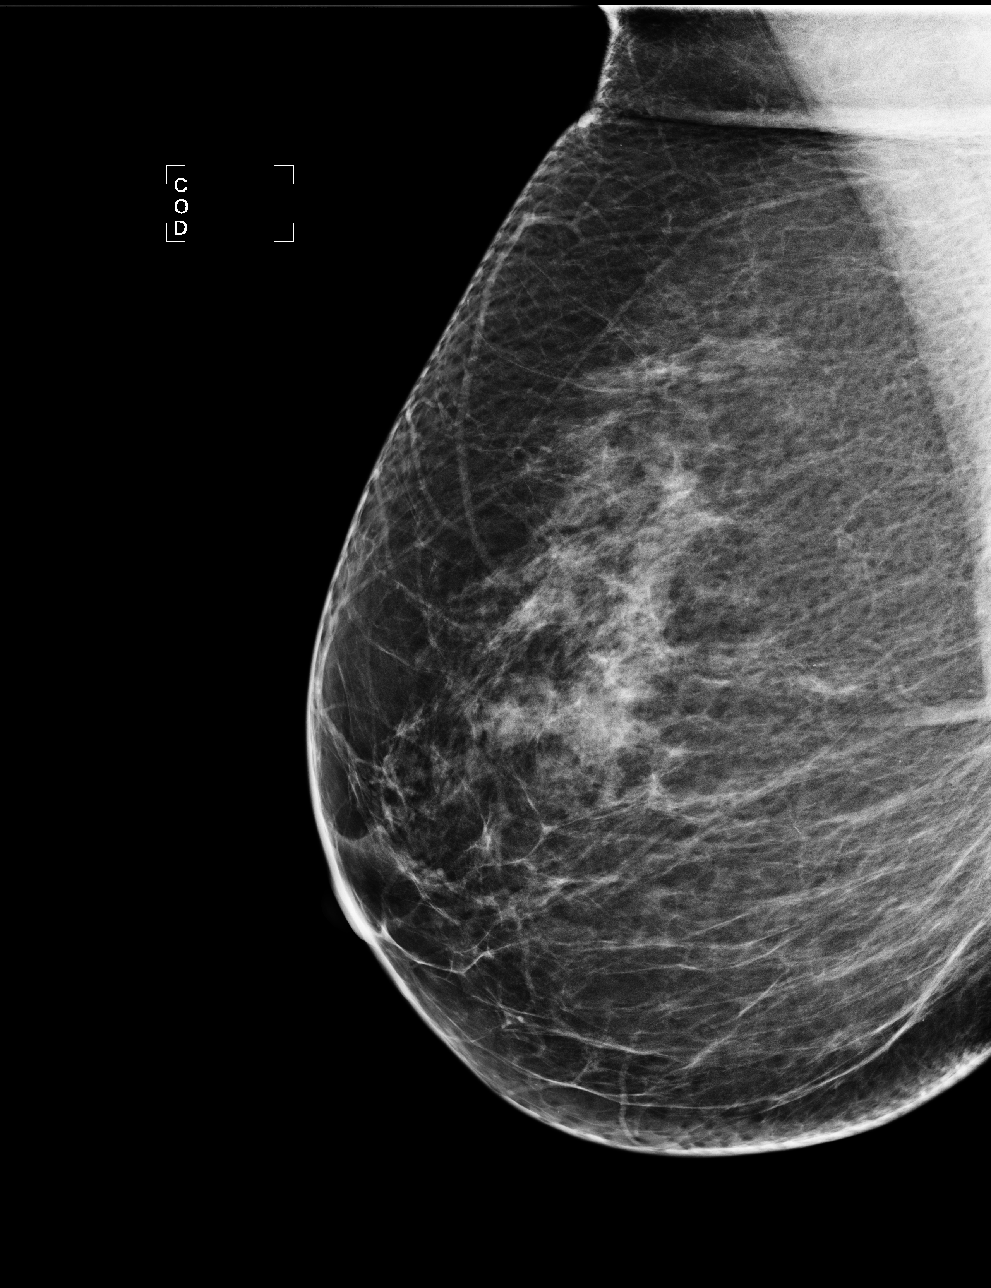

[4 of 4 positions shown; findings below may reference images not displayed]

FINDINGS: ACR Breast Density Category b:  There are scattered areas of
fibroglandular density.

There are no findings suspicious for malignancy.

Images were processed with CAD.
IMPRESSION: No mammographic evidence of malignancy.

A result letter of this screening mammogram will be mailed directly
to the patient.

RECOMMENDATION:
Screening mammogram in one year. (Code:JS-P-TL7)

BI-RADS CATEGORY 1:  Negative.

## 2014-04-29 ENCOUNTER — Telehealth: Payer: Self-pay | Admitting: Cardiovascular Disease

## 2014-04-29 NOTE — Telephone Encounter (Signed)
Closed encounter °

## 2014-05-10 ENCOUNTER — Ambulatory Visit (INDEPENDENT_AMBULATORY_CARE_PROVIDER_SITE_OTHER): Payer: 59 | Admitting: Cardiovascular Disease

## 2014-05-10 ENCOUNTER — Encounter (HOSPITAL_COMMUNITY): Payer: Self-pay | Admitting: *Deleted

## 2014-05-10 ENCOUNTER — Encounter: Payer: Self-pay | Admitting: Cardiovascular Disease

## 2014-05-10 VITALS — BP 152/78 | Ht 62.0 in | Wt 178.8 lb

## 2014-05-10 DIAGNOSIS — I209 Angina pectoris, unspecified: Secondary | ICD-10-CM

## 2014-05-10 DIAGNOSIS — I10 Essential (primary) hypertension: Secondary | ICD-10-CM

## 2014-05-10 DIAGNOSIS — Z79899 Other long term (current) drug therapy: Secondary | ICD-10-CM

## 2014-05-10 DIAGNOSIS — R0789 Other chest pain: Secondary | ICD-10-CM

## 2014-05-10 DIAGNOSIS — R079 Chest pain, unspecified: Secondary | ICD-10-CM

## 2014-05-10 NOTE — Patient Instructions (Signed)
Your physician has requested that you have en exercise stress myoview. For further information please visit [email or web address]. Please follow instruction sheet, as given.   Your physician recommends that you return for lab work fasting.  Your physician recommends that you schedule a follow-up appointment following these studies.

## 2014-05-10 NOTE — Assessment & Plan Note (Signed)
Patient is a 63 year old mildly overweight married Caucasian female with a four-month history of exertional chest pain. Her only risk factors are treated hypertension. She did have a abnormal stress test in 2006 and underwent cardiac catheterization by Dr. Percival Spanish at that time and normal coronary arteries. The patient however now is different than it was. It is reproducible, substernal with radiation to her back shoulders and neck associated with shortness of breath. I'm going to get an exercise Myoview stress test to risk stratify her.

## 2014-05-10 NOTE — Progress Notes (Signed)
05/10/2014 Luna Fuse   [birth year]  [id number]  Primary Physician Florina Ou, MD Primary Cardiologist: Lorretta Harp MD Renae Gloss   HPI:  Ms. 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 She was referred by Dr. Coral Else for evaluation of new onset exertional chest pain. Her cardiac risk factor profile is from October to hypertension. She did have an abnormal stress test back in 2006 and underwent cardiac catheterization by Dr. Percival Spanish revealing normal coronary arteries. The previous 4 months she has developed fairly typical exertional chest pain . The pain is substernal, pressure-like radiate radiating into her neck shoulders and back.   Current Outpatient Prescriptions  Medication Sig Dispense Refill  . Biotin 10 MG CAPS Take 1 tablet by mouth every other day.      Marland Kitchen CALCIUM CITRATE PO Take 1 tablet by mouth daily.      . hydrochlorothiazide (HYDRODIURIL) 50 MG tablet Take 50 mg by mouth daily.      . Naproxen Sodium (ALEVE) 220 MG CAPS Take 2 capsules by mouth as needed.       No current facility-administered medications for this visit.    No Known Allergies  History   Social History  . Marital Status: Married    Spouse Name: N/A    Number of Children: N/A  . Years of Education: N/A   Occupational History  . Not on file.   Social History Main Topics  . Smoking status: Never Smoker   . Smokeless tobacco: Not on file  . Alcohol Use: Not on file  . Drug Use: Not on file  . Sexual Activity: Not on file   Other Topics Concern  . Not on file   Social History Narrative  . No narrative on file     Review of Systems: General: negative for chills, fever, night sweats or weight changes.  Cardiovascular: negative for chest pain, dyspnea on exertion, edema, orthopnea, palpitations, paroxysmal nocturnal dyspnea or shortness of  breath Dermatological: negative for rash Respiratory: negative for cough or wheezing Urologic: negative for hematuria Abdominal: negative for nausea, vomiting, diarrhea, bright red blood per rectum, melena, or hematemesis Neurologic: negative for visual changes, syncope, or dizziness All other systems reviewed and are otherwise negative except as noted above.    Blood pressure 152/78, height 5\' 2"  (1.575 m), weight 178 lb 12.8 oz (81.103 kg).  General appearance: alert and no distress Neck: no adenopathy, no carotid bruit, no JVD, supple, symmetrical, trachea midline and thyroid not enlarged, symmetric, no tenderness/mass/nodules Lungs: clear to auscultation bilaterally Heart: regular rate and rhythm, S1, S2 normal, no murmur, click, rub or gallop Extremities: extremities normal, atraumatic, no cyanosis or edema  EKG normal sinus rhythm at 80 without ST or T wave changes  ASSESSMENT AND PLAN:   Essential hypertension Patient has hypertension controlled on HydroDIURIL  Chest pain Patient is a 63 year old mildly overweight married Caucasian female with a four-month history of exertional chest pain. Her only risk factors are treated hypertension. She did have a abnormal stress test in 2006 and underwent cardiac catheterization by Dr. Percival Spanish at that time and normal coronary arteries. The patient however now is different than it was. It is reproducible, substernal with radiation to her back shoulders and neck associated with shortness of breath. I'm going to get an exercise Myoview stress test to risk stratify her.      Pearletha Forge.  Gwenlyn Found MD [facility], [facility] 05/10/2014 12:31 PM

## 2014-05-10 NOTE — Assessment & Plan Note (Signed)
Patient has hypertension controlled on HydroDIURIL

## 2014-05-11 ENCOUNTER — Telehealth (HOSPITAL_COMMUNITY): Payer: Self-pay

## 2014-05-11 NOTE — Telephone Encounter (Signed)
Encounter complete. 

## 2014-05-12 ENCOUNTER — Ambulatory Visit (HOSPITAL_COMMUNITY)
Admission: RE | Admit: 2014-05-12 | Discharge: 2014-05-12 | Disposition: A | Discharge status: Home / Self Care | Payer: 59 | Source: Ambulatory Visit | Attending: Cardiovascular Disease | Admitting: Cardiovascular Disease

## 2014-05-12 DIAGNOSIS — R42 Dizziness and giddiness: Secondary | ICD-10-CM | POA: Insufficient documentation

## 2014-05-12 DIAGNOSIS — R0989 Other specified symptoms and signs involving the circulatory and respiratory systems: Secondary | ICD-10-CM | POA: Insufficient documentation

## 2014-05-12 DIAGNOSIS — R0789 Other chest pain: Secondary | ICD-10-CM

## 2014-05-12 DIAGNOSIS — R079 Chest pain, unspecified: Secondary | ICD-10-CM | POA: Insufficient documentation

## 2014-05-12 DIAGNOSIS — R0609 Other forms of dyspnea: Secondary | ICD-10-CM | POA: Insufficient documentation

## 2014-05-12 DIAGNOSIS — R0602 Shortness of breath: Secondary | ICD-10-CM | POA: Insufficient documentation

## 2014-05-12 DIAGNOSIS — I10 Essential (primary) hypertension: Secondary | ICD-10-CM | POA: Diagnosis not present

## 2014-05-12 LAB — HEPATIC FUNCTION PANEL
ALBUMIN: 4.4 g/dL (ref 3.5–5.2)
ALK PHOS: 70 U/L (ref 39–117)
ALT: 21 U/L (ref 0–35)
AST: 22 U/L (ref 0–37)
Bilirubin, Direct: 0.1 mg/dL (ref 0.0–0.3)
Indirect Bilirubin: 0.3 mg/dL (ref 0.2–1.2)
TOTAL PROTEIN: 7.4 g/dL (ref 6.0–8.3)
Total Bilirubin: 0.4 mg/dL (ref 0.2–1.2)

## 2014-05-12 LAB — LIPID PANEL
Cholesterol: 178 mg/dL (ref 0–200)
HDL: 62 mg/dL (ref >39)
LDL Cholesterol: 96 mg/dL (ref 0–99)
Total CHOL/HDL Ratio: 2.9 Ratio
Triglycerides: 99 mg/dL (ref <150)
VLDL: 20 mg/dL (ref 0–40)

## 2014-05-12 MED ORDER — TECHNETIUM TC 99M SESTAMIBI GENERIC - CARDIOLITE
30.5000 | Freq: Once | INTRAVENOUS | Status: AC | PRN
  Administered 2014-05-12: 31 via INTRAVENOUS

## 2014-05-12 MED ORDER — TECHNETIUM TC 99M SESTAMIBI GENERIC - CARDIOLITE
10.8000 | Freq: Once | INTRAVENOUS | Status: AC | PRN
  Administered 2014-05-12: 11 via INTRAVENOUS

## 2014-05-12 NOTE — Procedures (Addendum)
[place] [address] [place] [address] [phone]  Cardiology Nuclear Med Study  Laura [name] is a 63 y.o. female     MRN : [id number]     DOB: 1951/07/27  Procedure Date: 05/12/2014  Nuclear Med Background Indication for Stress Test:  Evaluation for Ischemia History:  No prior cardiac or respiratory history reported;Last NUC MPI in 2006; Cardiac Risk Factors: Hypertension and Overweight  Symptoms:  Chest Pain, Dizziness, DOE, Fatigue and SOB   Nuclear Pre-Procedure Caffeine/Decaff Intake:  7:00pm NPO After: 5:00am   IV Site: R Forearm  IV 0.9% NS with Angio Cath:  22g  Chest Size (in):  n/a IV Started by: Rolene Course, RN  Height: 5\' 2"  (1.575 m)  Cup Size: D  BMI:  Body mass index is 32.55 kg/(m^2). Weight:  178 lb (80.74 kg)   Tech Comments:  n/a    Nuclear Med Study 1 or 2 day study: 1 day  Stress Test Type:  Stress  Order Authorizing Provider:  Quay Burow, MD   Resting Radionuclide: Technetium 22m Sestamibi  Resting Radionuclide Dose: 10.8 mCi   Stress Radionuclide:  Technetium 60m Sestamibi  Stress Radionuclide Dose: 30.5 mCi           Stress Protocol Rest HR: 69 Stress HR:157  Rest BP: 131/90 Stress BP: 166/108  Exercise Time (min): 7:36 METS: 9.40   Predicted Max HR: 159 bpm % Max HR: 98.74 bpm Rate Pressure Product: 26062  Dose of Adenosine (mg):  n/a Dose of Lexiscan: n/a mg  Dose of Atropine (mg): n/a Dose of Dobutamine: n/a mcg/kg/min (at max HR)  Stress Test Technologist: Mellody Memos, CCT Nuclear Technologist: Imagene Riches, CNMT   Rest Procedure:  Myocardial perfusion imaging was performed at rest 45 minutes following the intravenous administration of Technetium 66m Sestamibi. Stress Procedure:  The patient performed treadmill exercise using a Bruce  Protocol for 7 minutes 36 seconds. The patient stopped due to shortness of breath and fatigue. Patient denied any chest pain.   There were  significant ST-T wave changes.  Technetium 67m Sestamibi was injected IV at peak exercise and myocardial perfusion imaging was performed after a brief delay.   Transient Ischemic Dilatation (Normal <1.22):  1.13  QGS EDV:  70 ml QGS ESV:  26 ml LV Ejection Fraction: 63%      Rest ECG: NSR , LVH by voltage only Stress ECG: Significant ST abnormalities consistent with ischemia. 1 -1.5 mm ST depression, especially prominent in the inferior leads  QPS Raw Data Images:  Normal; no motion artifact; normal heart/lung ratio. Stress Images:  Normal homogeneous uptake in all areas of the myocardium. Rest Images:  Normal homogeneous uptake in all areas of the myocardium. Subtraction (SDS):  No evidence of ischemia. LV Wall Motion:  NL LV Function; NL Wall Motion  Impression Exercise Capacity:  Good exercise capacity. BP Response:  Hypertensive blood pressure response. Clinical Symptoms:  Atypical chest pain. ECG Impression:  Significant ST abnormalities consistent with ischemia. Comparison with Prior Nuclear Study: No images to compare   Overall Impression:  Low risk stress nuclear study with normal perfusion images. Note development of angina and ECG changes at peak exercise. "Balanced ischemia" due to dominant left coronary stenosis or multivessel CAD cannot be excluded. Alternatively, changes may be related to hypertension.   Sanda Klein, MD  05/12/2014 1:26 PM

## 2014-05-15 ENCOUNTER — Encounter: Payer: Self-pay | Admitting: *Deleted

## 2014-05-23 ENCOUNTER — Ambulatory Visit (INDEPENDENT_AMBULATORY_CARE_PROVIDER_SITE_OTHER): Payer: 59 | Admitting: Cardiovascular Disease

## 2014-05-23 ENCOUNTER — Encounter: Payer: Self-pay | Admitting: Cardiovascular Disease

## 2014-05-23 VITALS — BP 122/74 | HR 88 | Ht 62.0 in | Wt 178.2 lb

## 2014-05-23 DIAGNOSIS — R079 Chest pain, unspecified: Secondary | ICD-10-CM

## 2014-05-23 NOTE — Assessment & Plan Note (Signed)
The patient no longer has chest pain. She attributes it to anxiety and stress. Recent Myoview showed normal images that she did have EKG changes of ischemia and chest pain. I suspect this was a false positive r graphic response. She did have normal coronaries by cath in 06. She is at goal for her lipids for primary prevention. I will see her back when necessary

## 2014-05-23 NOTE — Patient Instructions (Signed)
Follow up with Dr Berry as needed.  

## 2014-05-23 NOTE — Progress Notes (Signed)
Laura [name] returns today for followup of her myocardial perfusion study which was normal. She did have a positive electrocardiographic response to exercise but normal perfusion images. I have reassured her and we'll see her back as needed  Lorretta Harp, M.D., FACP, [facility], [place], [place] [address] [place], [place]  [address]  [phone] 05/23/2014 3:12 PM

## 2014-06-30 ENCOUNTER — Ambulatory Visit: Payer: 59 | Admitting: Cardiovascular Disease

## 2014-07-04 ENCOUNTER — Emergency Department (HOSPITAL_COMMUNITY)
Admission: EM | Admit: 2014-07-04 | Discharge: 2014-07-04 | Disposition: A | Discharge status: Home / Self Care | Payer: 59 | Attending: Emergency Medicine | Admitting: Emergency Medicine

## 2014-07-04 ENCOUNTER — Encounter (HOSPITAL_COMMUNITY): Payer: Self-pay | Admitting: Emergency Medicine

## 2014-07-04 ENCOUNTER — Emergency Department (HOSPITAL_COMMUNITY): Payer: 59

## 2014-07-04 DIAGNOSIS — R1012 Left upper quadrant pain: Secondary | ICD-10-CM | POA: Insufficient documentation

## 2014-07-04 DIAGNOSIS — R142 Eructation: Secondary | ICD-10-CM

## 2014-07-04 DIAGNOSIS — Z79899 Other long term (current) drug therapy: Secondary | ICD-10-CM | POA: Diagnosis not present

## 2014-07-04 DIAGNOSIS — R1110 Vomiting, unspecified: Secondary | ICD-10-CM | POA: Insufficient documentation

## 2014-07-04 DIAGNOSIS — R141 Gas pain: Secondary | ICD-10-CM | POA: Insufficient documentation

## 2014-07-04 DIAGNOSIS — I10 Essential (primary) hypertension: Secondary | ICD-10-CM | POA: Diagnosis not present

## 2014-07-04 DIAGNOSIS — R143 Flatulence: Secondary | ICD-10-CM | POA: Diagnosis not present

## 2014-07-04 LAB — CBC WITH DIFFERENTIAL/PLATELET
BASOS PCT: 1 % (ref 0–1)
Basophils Absolute: 0 10*3/uL (ref 0.0–0.1)
Eosinophils Absolute: 0.1 10*3/uL (ref 0.0–0.7)
Eosinophils Relative: 1 % (ref 0–5)
HCT: 42.3 % (ref 36.0–46.0)
HEMOGLOBIN: 14.3 g/dL (ref 12.0–15.0)
LYMPHS PCT: 24 % (ref 12–46)
Lymphs Abs: 1.8 10*3/uL (ref 0.7–4.0)
MCH: 29.3 pg (ref 26.0–34.0)
MCHC: 33.8 g/dL (ref 30.0–36.0)
MCV: 86.7 fL (ref 78.0–100.0)
MONO ABS: 0.8 10*3/uL (ref 0.1–1.0)
Monocytes Relative: 10 % (ref 3–12)
Neutro Abs: 4.7 10*3/uL (ref 1.7–7.7)
Neutrophils Relative %: 64 % (ref 43–77)
Platelets: 239 10*3/uL (ref 150–400)
RBC: 4.88 MIL/uL (ref 3.87–5.11)
RDW: 13.8 % (ref 11.5–15.5)
WBC: 7.4 10*3/uL (ref 4.0–10.5)

## 2014-07-04 LAB — URINALYSIS, ROUTINE W REFLEX MICROSCOPIC
Bilirubin Urine: NEGATIVE
GLUCOSE, UA: NEGATIVE mg/dL
Hgb urine dipstick: NEGATIVE
KETONES UR: NEGATIVE mg/dL
LEUKOCYTES UA: NEGATIVE
Nitrite: NEGATIVE
Protein, ur: NEGATIVE mg/dL
Specific Gravity, Urine: 1.013 (ref 1.005–1.030)
Urobilinogen, UA: 0.2 mg/dL (ref 0.0–1.0)
pH: 7.5 (ref 5.0–8.0)

## 2014-07-04 LAB — COMPREHENSIVE METABOLIC PANEL
ALK PHOS: 97 U/L (ref 39–117)
ALT: 19 U/L (ref 0–35)
ANION GAP: 15 (ref 5–15)
AST: 25 U/L (ref 0–37)
Albumin: 4.4 g/dL (ref 3.5–5.2)
BILIRUBIN TOTAL: 0.3 mg/dL (ref 0.3–1.2)
BUN: 14 mg/dL (ref 6–23)
CHLORIDE: 99 meq/L (ref 96–112)
CO2: 27 meq/L (ref 19–32)
Calcium: 9.7 mg/dL (ref 8.4–10.5)
Creatinine, Ser: 0.81 mg/dL (ref 0.50–1.10)
GFR, EST AFRICAN AMERICAN: 89 mL/min — AB (ref >90)
GFR, EST NON AFRICAN AMERICAN: 77 mL/min — AB (ref >90)
Glucose, Bld: 102 mg/dL — ABNORMAL HIGH (ref 70–99)
POTASSIUM: 3.4 meq/L — AB (ref 3.7–5.3)
Sodium: 141 mEq/L (ref 137–147)
Total Protein: 8 g/dL (ref 6.0–8.3)

## 2014-07-04 LAB — I-STAT TROPONIN, ED: Troponin i, poc: 0 ng/mL (ref 0.00–0.08)

## 2014-07-04 LAB — LIPASE, BLOOD: Lipase: 40 U/L (ref 11–59)

## 2014-07-04 MED ORDER — MORPHINE SULFATE 4 MG/ML IJ SOLN
4.0000 mg | Freq: Once | INTRAMUSCULAR | Status: AC
  Administered 2014-07-04: 4 mg via INTRAVENOUS
  Filled 2014-07-04: qty 1

## 2014-07-04 MED ORDER — DICYCLOMINE HCL 20 MG PO TABS: 20.0000 mg | ORAL_TABLET | Freq: Two times a day (BID) | ORAL | Status: DC

## 2014-07-04 MED ORDER — SIMETHICONE 125 MG PO CHEW: 125.0000 mg | CHEWABLE_TABLET | Freq: Four times a day (QID) | ORAL | Status: DC | PRN

## 2014-07-04 MED ORDER — ONDANSETRON HCL 4 MG/2ML IJ SOLN
4.0000 mg | Freq: Once | INTRAMUSCULAR | Status: AC
Start: 2014-07-04 — End: 2014-07-04
  Administered 2014-07-04: 4 mg via INTRAVENOUS
  Filled 2014-07-04 (×2): qty 2

## 2014-07-04 NOTE — ED Notes (Signed)
Per Ems pt co LLQ pain started this AM. Pt had colonoscopy yesterday. Pt was on liquid diet since yesterday, yet ate biscuit for breakfast which it caused pain onset per pt. Pt alert and oriented x4. Pt is nauseated and vomited x1.

## 2014-07-04 NOTE — ED Notes (Signed)
Bed: ZS01 Expected date:  Expected time:  Means of arrival:  Comments: EMS- RLQ abd pain, procedure yesterday

## 2014-07-04 NOTE — ED Provider Notes (Signed)
  This was a shared visit with a mid-level provided (NP or PA).  Throughout the patient's course I was available for consultation/collaboration.  I saw the ECG (if appropriate), relevant labs and studies - I agree with the interpretation.  On my exam the patient was in no distress. Symptoms have resolved entirely. As the patient's case with her gastroenterologist, who will follow up with the patient to ensure appropriate resolution of her symptoms.      Carmin Muskrat, MD 07/04/14 [phone]

## 2014-07-04 NOTE — ED Provider Notes (Signed)
CSN: [id number]     Arrival date & time 07/04/14  1025 History   First MD Initiated Contact with Patient 07/04/14 1035     Chief Complaint  Patient presents with  . Abdominal Pain  . Nausea     (Consider location/radiation/quality/duration/timing/severity/associated sxs/prior Treatment) HPI  Laura [name] is a(n) 63 y.o. female who presents with cc of LUQ abdominal pain. Onset about 7:30 am this morning. Patient had a colonoscopy yesterday without incidence. This morning at work she had sudden onset LUQ abdominal pain about 45 minutes after eating a sausage biscuit and coffee. She c/o Severe colicky pain which at times "doubled me over." She was seen by the in house plant [place] and sent to the ED. She c/o nausea but states that she feels it is from the ambulance ride over. She denies vomiting. Pain is still intense. She has not yet made a bowel movement. Denies fevers, chills, myalgias, arthralgias. Denies DOE, SOB, chest tightness or pressure, radiation to left arm, jaw or back, or diaphoresis. Denies dysuria, flank pain, suprapubic pain, frequency, urgency, or hematuria. Denies headaches, light headedness, weakness, visual disturbances.  Past Medical History  Diagnosis Date  . Chest pain   . Hypertension   . Normal cardiac stress test    No past surgical history on file. No family history on file. History  Substance Use Topics  . Smoking status: Never Smoker   . Smokeless tobacco: Not on file  . Alcohol Use: Not on file   OB History   Grav Para Term Preterm Abortions TAB SAB Ect Mult Living                 Review of Systems  Ten systems reviewed and are negative for acute change, except as noted in the HPI.    Allergies  Review of patient's allergies indicates no known allergies.  Home Medications   Prior to Admission medications   Medication Sig Start Date End Date Taking? Authorizing Provider  Biotin 10 MG CAPS Take 1 tablet by mouth every other day.     Historical Provider, MD  CALCIUM CITRATE PO Take 1 tablet by mouth daily.    Historical Provider, MD  hydrochlorothiazide (HYDRODIURIL) 50 MG tablet Take 50 mg by mouth daily.    Historical Provider, MD   BP 137/71  Pulse 67  Temp(Src) 97.6 F (36.4 C) (Oral)  Resp 19  SpO2 99% Physical Exam  Constitutional: She is oriented to person, place, and time. She appears well-developed and well-nourished. No distress.  HENT:  Head: Normocephalic and atraumatic.  Eyes: Conjunctivae are normal. No scleral icterus.  Neck: Normal range of motion.  Cardiovascular: Normal rate, regular rhythm and normal heart sounds.  Exam reveals no gallop and no friction rub.   No murmur heard. Pulmonary/Chest: Effort normal and breath sounds normal. No respiratory distress.  Abdominal: Soft. Bowel sounds are normal. She exhibits no distension and no mass. There is tenderness (Exquisietly ttp LUQ). There is no guarding.  Neurological: She is alert and oriented to person, place, and time.  Skin: Skin is warm and dry. She is not diaphoretic.    ED Course  Procedures (including critical care time) Labs Review Labs Reviewed - No data to display  Imaging Review No results found.   EKG Interpretation None      MDM   Final diagnoses:  Abdominal gas pain  Left upper quadrant pain    11:13 AM BP 137/71  Pulse 67  Temp(Src) 97.6 F (36.4  C) (Oral)  Resp 19  SpO2 99% Patient with TTP LUQ. Concern for possible perforation. Pain meds    12:45 PM BP 128/66  Pulse 67  Temp(Src) 97.6 F (36.4 C) (Oral)  Resp 13  SpO2 100% Patient pain resolved. There is no evidence of preforation on plain flm. No lab abnormalities suggesting more insidious etiology. Feel that he pain is realted to gas which is clear in the LUQ on the plain film. D/C with simethicone and bentyl. Patient / Family / Caregiver informed of clinical course, understand medical decision-making process, and agree with plan.  I personally  reviewed the imaging tests through PACS system. I have reviewed and interpreted Lab values. I reviewed available ER/hospitalization records through the EMR   The patient appears reasonably screened and/or stabilized for discharge and I doubt any other medical condition or other [facility] requiring further screening, evaluation, or treatment in the ED at this time prior to discharge.    Margarita Mail, PA-C 07/04/14 1249

## 2014-07-04 NOTE — Discharge Instructions (Signed)
Abdominal (belly) pain can be caused by many things. Your caregiver performed an examination and possibly ordered blood/urine tests and imaging (CT scan, x-rays, ultrasound). Many cases can be observed and treated at home after initial evaluation in the emergency department. Even though you are being discharged home, abdominal pain can be unpredictable. Therefore, you need a repeated exam if your pain does not resolve, returns, or worsens. Most patients with abdominal pain don't have to be admitted to the hospital or have surgery, but serious problems like appendicitis and gallbladder attacks can start out as nonspecific pain. Many abdominal conditions cannot be diagnosed in one visit, so follow-up evaluations are very important. SEEK IMMEDIATE MEDICAL ATTENTION IF: The pain does not go away or becomes severe.  A temperature above 101 develops.  Repeated vomiting occurs (multiple episodes).  The pain becomes localized to portions of the abdomen. The right side could possibly be appendicitis. In an adult, the left lower portion of the abdomen could be colitis or diverticulitis.  Blood is being passed in stools or vomit (bright red or black tarry stools).  Return also if you develop chest pain, difficulty breathing, dizziness or fainting, or become confused, poorly responsive, or inconsolable (young children).   Abdominal Pain, Women Abdominal (stomach, pelvic, or belly) pain can be caused by many things. It is important to tell your doctor:  The location of the pain.  Does it come and go or is it present all the time?  Are there things that start the pain (eating certain foods, exercise)?  Are there other symptoms associated with the pain (fever, nausea, vomiting, diarrhea)? All of this is helpful to know when trying to find the cause of the pain. CAUSES   Stomach: virus or bacteria infection, or ulcer.  Intestine: appendicitis (inflamed appendix), regional ileitis (Crohn's disease),  ulcerative colitis (inflamed colon), irritable bowel syndrome, diverticulitis (inflamed diverticulum of the colon), or cancer of the stomach or intestine.  Gallbladder disease or stones in the gallbladder.  Kidney disease, kidney stones, or infection.  Pancreas infection or cancer.  Fibromyalgia (pain disorder).  Diseases of the female organs:  Uterus: fibroid (non-cancerous) tumors or infection.  Fallopian tubes: infection or tubal pregnancy.  Ovary: cysts or tumors.  Pelvic adhesions (scar tissue).  Endometriosis (uterus lining tissue growing in the pelvis and on the pelvic organs).  Pelvic congestion syndrome (female organs filling up with blood just before the menstrual period).  Pain with the menstrual period.  Pain with ovulation (producing an egg).  Pain with an IUD (intrauterine device, birth control) in the uterus.  Cancer of the female organs.  Functional pain (pain not caused by a disease, may improve without treatment).  Psychological pain.  Depression. DIAGNOSIS  Your doctor will decide the seriousness of your pain by doing an examination.  Blood tests.  X-rays.  Ultrasound.  CT scan (computed tomography, special type of X-ray).  MRI (magnetic resonance imaging).  Cultures, for infection.  Barium enema (dye inserted in the large intestine, to better view it with X-rays).  Colonoscopy (looking in intestine with a lighted tube).  Laparoscopy (minor surgery, looking in abdomen with a lighted tube).  Major abdominal exploratory surgery (looking in abdomen with a large incision). TREATMENT  The treatment will depend on the cause of the pain.   Many cases can be observed and treated at home.  Over-the-counter medicines recommended by your caregiver.  Prescription medicine.  Antibiotics, for infection.  Birth control pills, for painful periods or for ovulation pain.  Hormone treatment, for endometriosis.  Nerve blocking  injections.  Physical therapy.  Antidepressants.  Counseling with a psychologist or psychiatrist.  Minor or major surgery. HOME CARE INSTRUCTIONS   Do not take laxatives, unless directed by your caregiver.  Take over-the-counter pain medicine only if ordered by your caregiver. Do not take aspirin because it can cause an upset stomach or bleeding.  Try a clear liquid diet (broth or water) as ordered by your caregiver. Slowly move to a bland diet, as tolerated, if the pain is related to the stomach or intestine.  Have a thermometer and take your temperature several times a day, and record it.  Bed rest and sleep, if it helps the pain.  Avoid sexual intercourse, if it causes pain.  Avoid stressful situations.  Keep your follow-up appointments and tests, as your caregiver orders.  If the pain does not go away with medicine or surgery, you may try:  Acupuncture.  Relaxation exercises (yoga, meditation).  Group therapy.  Counseling. SEEK MEDICAL CARE IF:   You notice certain foods cause stomach pain.  Your home care treatment is not helping your pain.  You need stronger pain medicine.  You want your IUD removed.  You feel faint or lightheaded.  You develop nausea and vomiting.  You develop a rash.  You are having side effects or an allergy to your medicine. SEEK IMMEDIATE MEDICAL CARE IF:   Your pain does not go away or gets worse.  You have a fever.  Your pain is felt only in portions of the abdomen. The right side could possibly be appendicitis. The left lower portion of the abdomen could be colitis or diverticulitis.  You are passing blood in your stools (bright red or black tarry stools, with or without vomiting).  You have blood in your urine.  You develop chills, with or without a fever.  You pass out. MAKE SURE YOU:   Understand these instructions.  Will watch your condition.  Will get help right away if you are not doing well or get  worse. Document Released: 08/03/2007 Document Revised: 02/20/2014 Document Reviewed: 08/23/2009 [facility] Patient Information 2015 [place], [place]. This information is not intended to replace advice given to you by your health care provider. Make sure you discuss any questions you have with your health care provider.  Flatulence There are good germs in your gut to help you digest food. Gas is produced by these germs and released from your bottom. Most people release 3 to 4 quarts of gas every day. This is normal. HOME CARE  Eat or drink less of the foods or liquids that give you gas.  Take the time to chew your food well. Talk less while you eat.  Do not suck on ice or hard candy.  Sip slowly. Stir some of the bubbles out of fizzy drinks with a spoon or straw.  Avoid chewing gum or smoking.  Ask your doctor about liquids and tablets that may help control burping and gas.  Only take medicine as told by your doctor. GET HELP RIGHT AWAY IF:   There is discomfort when you burp or pass gas.  You throw up (vomit) when you burp.  Poop (stool) comes out when you pass gas.  Your belly is puffy (swollen) and hard. MAKE SURE YOU:   Understand these instructions.  Will watch your condition.  Will get help right away if you are not doing well or get worse. Document Released: 08/08/2008 Document Revised: 12/29/2011 Document Reviewed: 08/08/2008  [employer]® Patient Information ©2015 [employer]. This information is not intended to replace advice given to you by your health care provider. Make sure you discuss any questions you have with your health care provider. ° °

## 2014-08-04 ENCOUNTER — Other Ambulatory Visit: Payer: Self-pay

## 2015-06-15 IMAGING — CR DG ABDOMEN ACUTE W/ 1V CHEST
4 series · 4 of 4 positions shown · non-contrast
Comparison: 08/19/2010.

CLINICAL DATA: ABDOMINAL PAIN NAUSEA colonoscopy yesterday.

EXAM:
ACUTE ABDOMEN SERIES (ABDOMEN 2 VIEW & CHEST 1 VIEW)

[w chest pa]
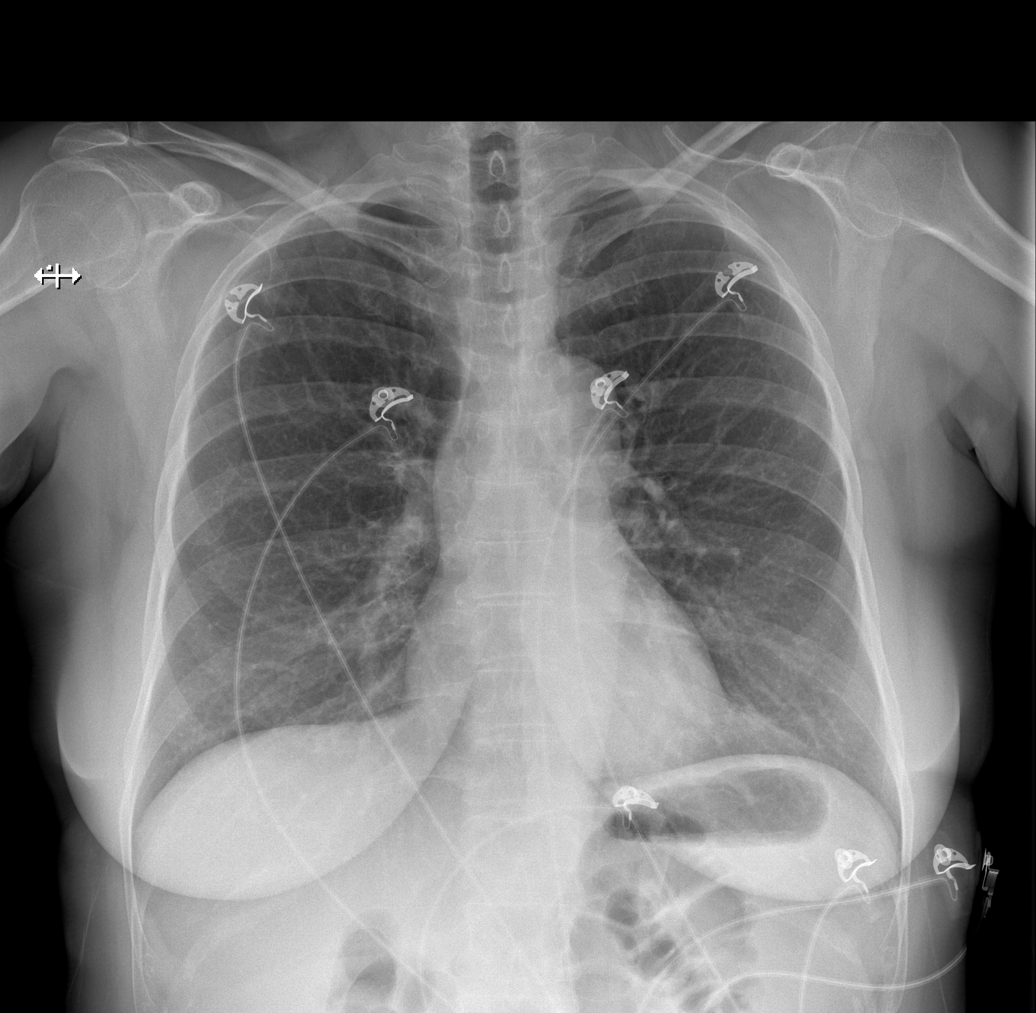

[w abdomen upright]
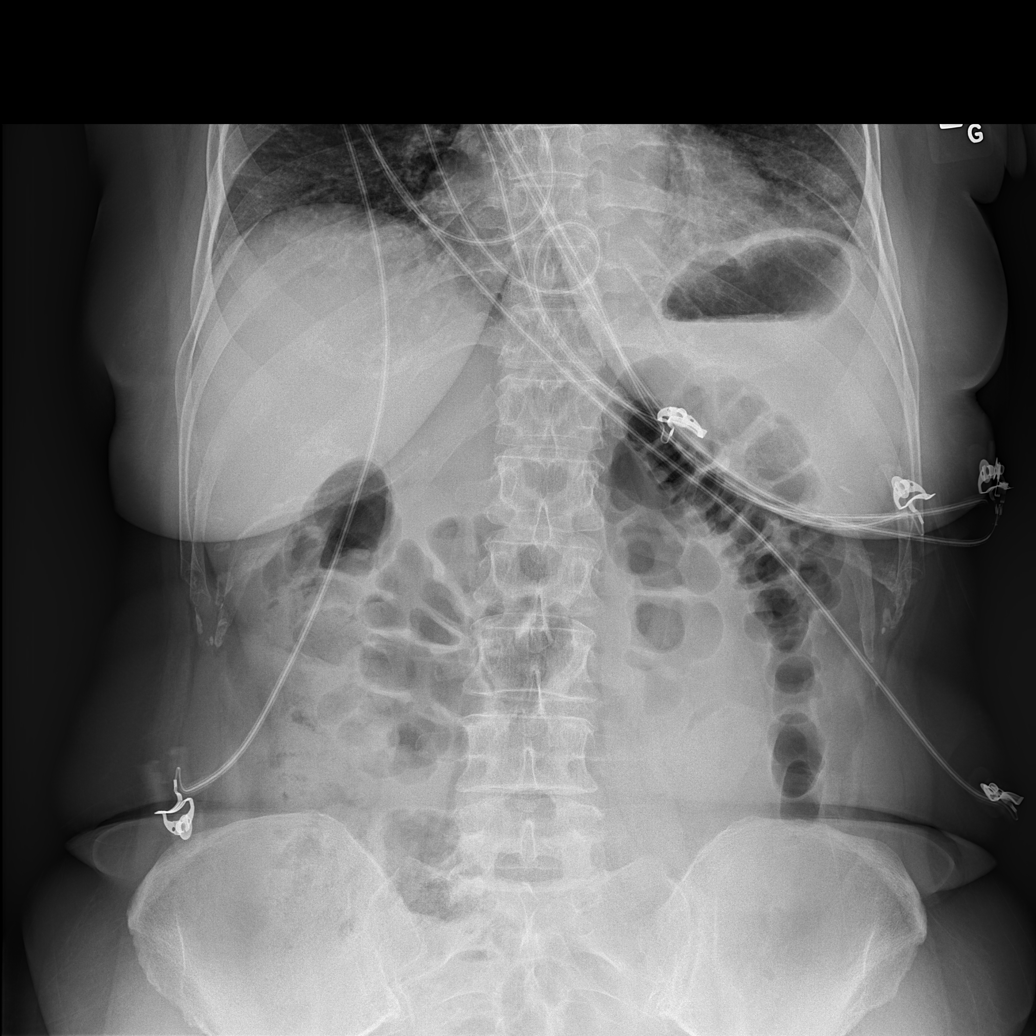

[t abdomen supine (1 of 2)]
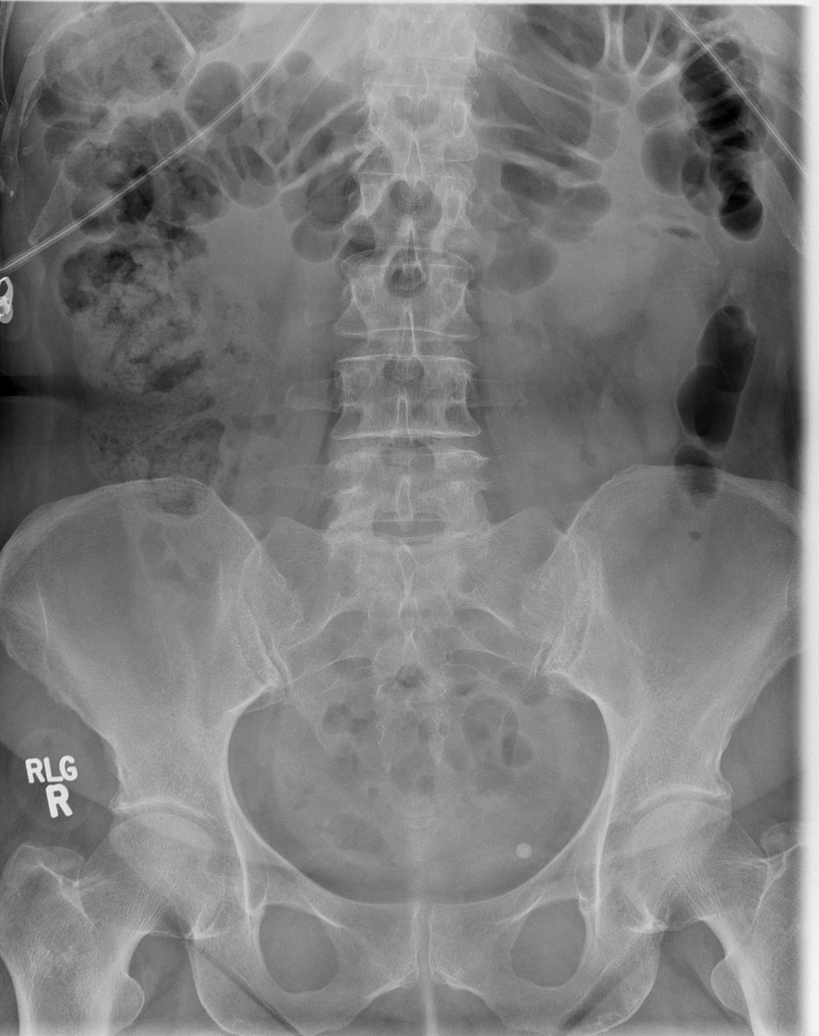

[t abdomen supine (2 of 2)]
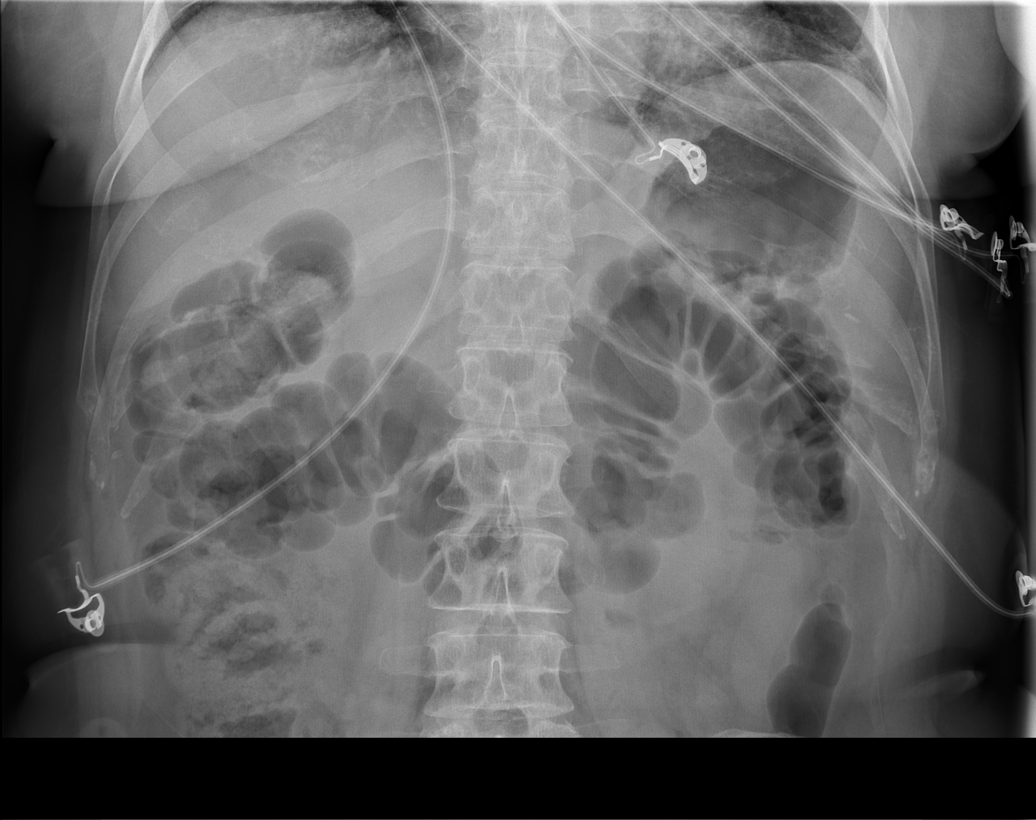

[4 of 4 positions shown; findings below may reference images not displayed]

FINDINGS: There is no evidence of dilated bowel loops or free intraperitoneal
air. No radiopaque calculi or other significant radiographic
abnormality is seen. Heart size and mediastinal contours are within
normal limits. Both lungs are clear. Gaseous distention of the colon
is present. Stool is present in the cecum and ascending colon.
IMPRESSION: Negative abdominal radiographs. No acute cardiopulmonary disease. No
free air status post colonoscopy.

## 2015-06-21 ENCOUNTER — Encounter: Payer: Self-pay | Admitting: *Deleted

## 2015-07-16 ENCOUNTER — Encounter: Payer: Self-pay | Admitting: Family Medicine

## 2015-07-16 ENCOUNTER — Ambulatory Visit
Admission: RE | Admit: 2015-07-16 | Discharge: 2015-07-16 | Disposition: A | Discharge status: Home / Self Care | Payer: 59 | Source: Ambulatory Visit | Attending: Family Medicine | Admitting: Family Medicine

## 2015-07-16 ENCOUNTER — Ambulatory Visit (INDEPENDENT_AMBULATORY_CARE_PROVIDER_SITE_OTHER): Payer: 59 | Admitting: Family Medicine

## 2015-07-16 VITALS — BP 128/78 | HR 70 | Temp 97.9°F | Resp 14 | Ht 62.0 in | Wt 175.0 lb

## 2015-07-16 DIAGNOSIS — Z0000 Encounter for general adult medical examination without abnormal findings: Secondary | ICD-10-CM | POA: Diagnosis not present

## 2015-07-16 DIAGNOSIS — Z1239 Encounter for other screening for malignant neoplasm of breast: Secondary | ICD-10-CM

## 2015-07-16 DIAGNOSIS — M546 Pain in thoracic spine: Secondary | ICD-10-CM

## 2015-07-16 DIAGNOSIS — F418 Other specified anxiety disorders: Secondary | ICD-10-CM | POA: Diagnosis not present

## 2015-07-16 DIAGNOSIS — I10 Essential (primary) hypertension: Secondary | ICD-10-CM

## 2015-07-16 DIAGNOSIS — Z1321 Encounter for screening for nutritional disorder: Secondary | ICD-10-CM

## 2015-07-16 DIAGNOSIS — S161XXA Strain of muscle, fascia and tendon at neck level, initial encounter: Secondary | ICD-10-CM

## 2015-07-16 DIAGNOSIS — Z23 Encounter for immunization: Secondary | ICD-10-CM

## 2015-07-16 LAB — LIPID PANEL
CHOL/HDL RATIO: 3.4 ratio (ref <=5.0)
CHOLESTEROL: 189 mg/dL (ref 125–200)
HDL: 55 mg/dL (ref >=46)
LDL CALC: 108 mg/dL (ref <130)
Triglycerides: 129 mg/dL (ref <150)
VLDL: 26 mg/dL (ref <30)

## 2015-07-16 LAB — CBC WITH DIFFERENTIAL/PLATELET
Basophils Absolute: 0.1 10*3/uL (ref 0.0–0.1)
Basophils Relative: 1 % (ref 0–1)
EOS PCT: 3 % (ref 0–5)
Eosinophils Absolute: 0.2 10*3/uL (ref 0.0–0.7)
HCT: 41.8 % (ref 36.0–46.0)
Hemoglobin: 13.8 g/dL (ref 12.0–15.0)
LYMPHS ABS: 2.4 10*3/uL (ref 0.7–4.0)
Lymphocytes Relative: 32 % (ref 12–46)
MCH: 29.7 pg (ref 26.0–34.0)
MCHC: 33 g/dL (ref 30.0–36.0)
MCV: 89.9 fL (ref 78.0–100.0)
MONO ABS: 0.7 10*3/uL (ref 0.1–1.0)
MONOS PCT: 9 % (ref 3–12)
MPV: 10.4 fL (ref 8.6–12.4)
NEUTROS PCT: 55 % (ref 43–77)
Neutro Abs: 4.1 10*3/uL (ref 1.7–7.7)
Platelets: 265 10*3/uL (ref 150–400)
RBC: 4.65 MIL/uL (ref 3.87–5.11)
RDW: 13.7 % (ref 11.5–15.5)
WBC: 7.5 10*3/uL (ref 4.0–10.5)

## 2015-07-16 LAB — COMPREHENSIVE METABOLIC PANEL
ALT: 18 U/L (ref 6–29)
AST: 22 U/L (ref 10–35)
Albumin: 4.1 g/dL (ref 3.6–5.1)
Alkaline Phosphatase: 69 U/L (ref 33–130)
BUN: 15 mg/dL (ref 7–25)
CHLORIDE: 99 mmol/L (ref 98–110)
CO2: 32 mmol/L — AB (ref 20–31)
CREATININE: 0.78 mg/dL (ref 0.50–0.99)
Calcium: 9.7 mg/dL (ref 8.6–10.4)
GLUCOSE: 78 mg/dL (ref 70–99)
POTASSIUM: 4.1 mmol/L (ref 3.5–5.3)
SODIUM: 140 mmol/L (ref 135–146)
TOTAL PROTEIN: 6.9 g/dL (ref 6.1–8.1)
Total Bilirubin: 0.6 mg/dL (ref 0.2–1.2)

## 2015-07-16 NOTE — Patient Instructions (Addendum)
I recommend eye visit once a year I recommend dental visit every 6 months Goal is to  Exercise 30 minutes 5 days a week We will send a letter with lab results  Flu shot given  Release of records- Dr. Oletta Lamas- GI last Colonoscopy  [facility]- last PAP , Mammogram, Bone Density  Get the xray of your spine - we will call with results  Increase prozac to 20mg  for now  F/u 6 MONTHS
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
## 2015-07-17 DIAGNOSIS — F418 Other specified anxiety disorders: Secondary | ICD-10-CM | POA: Insufficient documentation

## 2015-07-17 LAB — TSH: TSH: 1.296 u[IU]/mL (ref 0.350–4.500)

## 2015-07-17 LAB — VITAMIN D 25 HYDROXY (VIT D DEFICIENCY, FRACTURES): VIT D 25 HYDROXY: 33 ng/mL (ref 30–100)

## 2015-07-17 NOTE — Assessment & Plan Note (Signed)
Blood pressure well controlled no change to hydrochlorothiazide. Her fasting labs are done today. I reviewed her records from her previous primary care provider.
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
## 2015-10-11 ENCOUNTER — Encounter: Payer: Self-pay | Admitting: Physician Assistant

## 2015-10-11 ENCOUNTER — Ambulatory Visit (INDEPENDENT_AMBULATORY_CARE_PROVIDER_SITE_OTHER): Payer: 59 | Admitting: Physician Assistant

## 2015-10-11 VITALS — BP 130/68 | HR 70 | Temp 98.0°F | Resp 16 | Ht 62.0 in | Wt 170.0 lb

## 2015-10-11 DIAGNOSIS — J988 Other specified respiratory disorders: Secondary | ICD-10-CM

## 2015-10-11 DIAGNOSIS — B9689 Other specified bacterial agents as the cause of diseases classified elsewhere: Principal | ICD-10-CM

## 2015-10-11 MED ORDER — AZITHROMYCIN 250 MG PO TABS: ORAL_TABLET | ORAL | Status: DC

## 2015-10-11 MED ORDER — HYDROCOD POLST-CPM POLST ER 10-8 MG/5ML PO SUER: 5.0000 mL | Freq: Two times a day (BID) | ORAL | Status: DC | PRN

## 2015-10-11 NOTE — Progress Notes (Signed)
    Patient ID: Laura [name] MRN: [id number], DOB: 05/23/1951, 64 y.o. Date of Encounter: 10/11/2015, 3:35 PM    Chief Complaint:  Chief Complaint  Patient presents with  . Illness    x1 week- productive cough with green to yellow sputum, worsens at night     HPI: 64 y.o. year old white female presents with above. Is that she has had some head and nasal congestion and mucus from the nose but mostly the chest seems to be worse. Had some sore throat in the beginning but that has resolved. Says that she has been getting little sleep secondary to congestion and cough. No fevers or chills.     Home Meds:   Outpatient Prescriptions Prior to Visit  Medication Sig Dispense Refill  . FLUoxetine (PROZAC) 10 MG capsule Take 10 mg by mouth daily.    . hydrochlorothiazide (HYDRODIURIL) 50 MG tablet Take 50 mg by mouth daily.    . Multiple Vitamins-Minerals (HAIR SKIN AND NAILS FORMULA) TABS Take 3 tablets by mouth daily.     No facility-administered medications prior to visit.    Allergies:  Allergies  Allergen Reactions  . Oxycodone Nausea And Vomiting      Review of Systems: See HPI for pertinent ROS. All other ROS negative.    Physical Exam: Blood pressure 130/68, pulse 70, temperature 98 F (36.7 C), temperature source Oral, resp. rate 16, height 5\' 2"  (1.575 m), weight 170 lb (77.111 kg), SpO2 97 %., Body mass index is 31.09 kg/(m^2). General: WNWD WF.  Appears in no acute distress. HEENT: Normocephalic, atraumatic, eyes without discharge, sclera non-icteric, nares are without discharge. Bilateral auditory canals clear, TM's are without perforation, pearly grey and translucent with reflective cone of light bilaterally. Oral cavity moist, posterior pharynx without exudate, erythema, peritonsillar abscess.  Neck: Supple. No thyromegaly. No lymphadenopathy. Lungs: Clear bilaterally to auscultation without wheezes, rales, or rhonchi. Breathing is unlabored. Heart: Regular rhythm.  No murmurs, rubs, or gallops. Msk:  Strength and tone normal for age. Extremities/Skin: Warm and dry. Neuro: Alert and oriented X 3. Moves all extremities spontaneously. Gait is normal. CNII-XII grossly in tact. Psych:  Responds to questions appropriately with a normal affect.     ASSESSMENT AND PLAN:  64 y.o. year old female with  1. Bacterial respiratory infection She is to take antibiotic as directed. Can use Tussionex as cough suppressant especially for sleep. Follow-up if symptoms do not resolve within 1 week after completion of antibiotic. - azithromycin (ZITHROMAX) 250 MG tablet; Day 1: Take 2 daily.  Days 2-5: Take 1 daily.  Dispense: 6 tablet; Refill: 0 - chlorpheniramine-HYDROcodone (TUSSIONEX PENNKINETIC ER) 10-8 MG/5ML SUER; Take 5 mLs by mouth every 12 (twelve) hours as needed for cough.  Dispense: 140 mL; Refill: 0   Signed, [address] [place], [place], [facility] 10/11/2015 3:35 PM

## 2016-01-14 ENCOUNTER — Ambulatory Visit: Payer: 59 | Admitting: Family Medicine

## 2016-05-15 ENCOUNTER — Other Ambulatory Visit: Payer: Self-pay | Admitting: Gastroenterology

## 2016-05-15 DIAGNOSIS — R1084 Generalized abdominal pain: Secondary | ICD-10-CM

## 2016-05-21 ENCOUNTER — Ambulatory Visit
Admission: RE | Admit: 2016-05-21 | Discharge: 2016-05-21 | Disposition: A | Discharge status: Home / Self Care | Payer: 59 | Source: Ambulatory Visit | Attending: Gastroenterology | Admitting: Gastroenterology

## 2016-05-21 DIAGNOSIS — R1084 Generalized abdominal pain: Secondary | ICD-10-CM

## 2016-05-21 MED ORDER — IOPAMIDOL (ISOVUE-300) INJECTION 61%: 100.0000 mL | Freq: Once | INTRAVENOUS | Status: DC | PRN
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
## 2016-07-04 ENCOUNTER — Other Ambulatory Visit: Payer: Self-pay | Admitting: Family Medicine

## 2016-07-23 ENCOUNTER — Encounter: Payer: Self-pay | Admitting: Family Medicine

## 2016-07-23 ENCOUNTER — Ambulatory Visit (INDEPENDENT_AMBULATORY_CARE_PROVIDER_SITE_OTHER): Payer: 59 | Admitting: Family Medicine

## 2016-07-23 VITALS — BP 120/72 | HR 88 | Temp 98.8°F | Resp 16 | Ht 62.0 in | Wt 176.0 lb

## 2016-07-23 DIAGNOSIS — F418 Other specified anxiety disorders: Secondary | ICD-10-CM

## 2016-07-23 DIAGNOSIS — I10 Essential (primary) hypertension: Secondary | ICD-10-CM | POA: Diagnosis not present

## 2016-07-23 DIAGNOSIS — Z1321 Encounter for screening for nutritional disorder: Secondary | ICD-10-CM

## 2016-07-23 DIAGNOSIS — Z0000 Encounter for general adult medical examination without abnormal findings: Secondary | ICD-10-CM | POA: Diagnosis not present

## 2016-07-23 DIAGNOSIS — Z23 Encounter for immunization: Secondary | ICD-10-CM

## 2016-07-23 LAB — CBC WITH DIFFERENTIAL/PLATELET
BASOS PCT: 1 %
Basophils Absolute: 59 cells/uL (ref 0–200)
EOS ABS: 177 {cells}/uL (ref 15–500)
Eosinophils Relative: 3 %
HEMATOCRIT: 42.4 % (ref 35.0–45.0)
HEMOGLOBIN: 14.1 g/dL (ref 12.0–15.0)
LYMPHS ABS: 2242 {cells}/uL (ref 850–3900)
LYMPHS PCT: 38 %
MCH: 29.6 pg (ref 27.0–33.0)
MCHC: 33.3 g/dL (ref 32.0–36.0)
MCV: 88.9 fL (ref 80.0–100.0)
MONO ABS: 531 {cells}/uL (ref 200–950)
MPV: 10.5 fL (ref 7.5–12.5)
Monocytes Relative: 9 %
Neutro Abs: 2891 cells/uL (ref 1500–7800)
Neutrophils Relative %: 49 %
Platelets: 288 10*3/uL (ref 140–400)
RBC: 4.77 MIL/uL (ref 3.80–5.10)
RDW: 14.2 % (ref 11.0–15.0)
WBC: 5.9 10*3/uL (ref 3.8–10.8)

## 2016-07-23 LAB — COMPREHENSIVE METABOLIC PANEL
ALBUMIN: 4.3 g/dL (ref 3.6–5.1)
ALK PHOS: 71 U/L (ref 33–130)
ALT: 15 U/L (ref 6–29)
AST: 21 U/L (ref 10–35)
BUN: 12 mg/dL (ref 7–25)
CALCIUM: 9.7 mg/dL (ref 8.6–10.4)
CO2: 29 mmol/L (ref 20–31)
Chloride: 100 mmol/L (ref 98–110)
Creat: 0.75 mg/dL (ref 0.50–0.99)
GLUCOSE: 78 mg/dL (ref 70–99)
Potassium: 3.6 mmol/L (ref 3.5–5.3)
Sodium: 139 mmol/L (ref 135–146)
Total Bilirubin: 0.6 mg/dL (ref 0.2–1.2)
Total Protein: 7 g/dL (ref 6.1–8.1)

## 2016-07-23 LAB — LIPID PANEL
Cholesterol: 185 mg/dL (ref 125–200)
HDL: 52 mg/dL (ref >=46)
LDL Cholesterol: 112 mg/dL (ref <130)
Total CHOL/HDL Ratio: 3.6 Ratio (ref <=5.0)
Triglycerides: 107 mg/dL (ref <150)
VLDL: 21 mg/dL (ref <30)

## 2016-07-23 LAB — TSH: TSH: 1.77 m[IU]/L

## 2016-07-23 MED ORDER — ZOSTER VACCINE LIVE 19400 UNT/0.65ML ~~LOC~~ SUSR: 0.6500 mL | Freq: Once | SUBCUTANEOUS | 0 refills | Status: AC

## 2016-07-23 NOTE — Assessment & Plan Note (Addendum)
Controlled, if K still low may need to add low dose replacement with her HCTZ Also mild atherosclerosis noted on her CT scan we'll check her cholesterol level today.

## 2016-07-23 NOTE — Progress Notes (Signed)
Subjective:    Patient ID: Laura [name], female    DOB: 1950-11-04, 65 y.o.   MRN: [id number]  Patient presents for CPE (is fasting)   Patient here for complete physical exam. She is followed by her GYN she had mammogram done in February her Pap smears up-to-date. She has a gastroenterologist who she saw in August after having what sounds like gastroenteritis she has CT scan done which showed benign liver cyst as well as some mild atherosclerosis but no acute abdominal problem. She still uses dicyclomine as needed. She did have some hypokalemia during that time but she also had nausea vomiting diarrhea. She replaces switches her diet. She is due for recheck on her potassium.  Her colonoscopy is up-to-date she states this is due in 2020 I do not have a copy of this on file.  She continues to take her hydrochlorothiazide as prescribed she is also on her fluoxetine which is prescribed by her GYN. 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  Due for flu/shingles   Review Of Systems:  GEN- denies fatigue, fever, weight loss,weakness, recent illness HEENT- denies eye drainage, change in vision, nasal discharge, CVS- denies chest pain, palpitations RESP- denies SOB, cough, wheeze ABD- denies N/V, change in stools, abd pain GU- denies dysuria, hematuria, dribbling, incontinence MSK- denies joint pain, muscle aches, injury Neuro- denies headache, dizziness, syncope, seizure activity       Objective:    BP 120/72 (BP Location: Left Arm, Patient Position: Sitting, Cuff Size: Large)   Pulse 88   Temp 98.8 F (37.1 C) (Oral)   Resp 16   Ht 5\' 2"  (1.575 m)   Wt 176 lb (79.8 kg)   BMI 32.19 kg/m  GEN- NAD, alert and oriented x3 HEENT- PERRL, EOMI, non injected sclera, pink conjunctiva, MMM, oropharynx clear Neck- Supple, no thyromegaly CVS- RRR, no  murmur RESP-CTAB ABD-NABS,soft,NT,ND. No HSM  Skin- yellowed small bruise on abdomen Psych- normal affect and mood EXT- No edema Pulses- Radial, DP- 2+        Assessment & Plan:      Problem List Items Addressed This Visit    Essential hypertension    Controlled, if K still low may need to add low dose replacement with her HCTZ Also mild atherosclerosis noted on her CT scan we'll check her cholesterol level today.      Relevant Orders   TSH   Depression with anxiety    Continue with the fluoxetine at current dose       Other Visit Diagnoses    Routine general medical examination at a health care facility    -  Primary   CPE done, flu shot done, obtain colonoscopy,shingles to pharmacy, declines hep C and HIV , no risk factors   Relevant Orders   CBC with Differential/Platelet   Comprehensive metabolic panel   Lipid panel   Encounter for vitamin deficiency screening       Relevant Orders   Vitamin D, 25-hydroxy   Need for prophylactic vaccination and inoculation against influenza       Relevant Orders   Flu Vaccine QUAD 36+ mos PF IM (Fluarix & Fluzone Quad PF) (Completed)      Note: This dictation was prepared with Dragon dictation along with smaller phrase technology. Any transcriptional errors that result from this process are unintentional.

## 2016-07-23 NOTE — Patient Instructions (Signed)
I recommend eye visit once a year I recommend dental visit every 6 months Goal is to  Exercise 30 minutes 5 days a week We will send a letter with lab results  F/u 1 YEAR or as needed

## 2016-07-23 NOTE — Assessment & Plan Note (Signed)
Continue with the fluoxetine at current dose

## 2016-07-24 LAB — VITAMIN D 25 HYDROXY (VIT D DEFICIENCY, FRACTURES): Vit D, 25-Hydroxy: 37 ng/mL (ref 30–100)

## 2016-10-03 ENCOUNTER — Other Ambulatory Visit: Payer: Self-pay | Admitting: Family Medicine

## 2017-01-10 ENCOUNTER — Other Ambulatory Visit: Payer: Self-pay | Admitting: Family Medicine

## 2017-04-11 ENCOUNTER — Other Ambulatory Visit: Payer: Self-pay | Admitting: Family Medicine

## 2017-07-31 ENCOUNTER — Encounter: Payer: 59 | Admitting: Family Medicine

## 2017-08-10 ENCOUNTER — Encounter: Payer: Self-pay | Admitting: Family Medicine

## 2017-08-10 ENCOUNTER — Ambulatory Visit (INDEPENDENT_AMBULATORY_CARE_PROVIDER_SITE_OTHER): Payer: Medicare Other | Admitting: Family Medicine

## 2017-08-10 ENCOUNTER — Encounter: Payer: Self-pay | Admitting: *Deleted

## 2017-08-10 VITALS — BP 112/78 | HR 60 | Temp 97.9°F | Resp 18 | Ht 61.0 in | Wt 172.0 lb

## 2017-08-10 DIAGNOSIS — Z23 Encounter for immunization: Secondary | ICD-10-CM

## 2017-08-10 DIAGNOSIS — Z0000 Encounter for general adult medical examination without abnormal findings: Secondary | ICD-10-CM

## 2017-08-10 DIAGNOSIS — F418 Other specified anxiety disorders: Secondary | ICD-10-CM

## 2017-08-10 DIAGNOSIS — I10 Essential (primary) hypertension: Secondary | ICD-10-CM | POA: Diagnosis not present

## 2017-08-10 LAB — COMPREHENSIVE METABOLIC PANEL
AG Ratio: 1.5 (calc) (ref 1.0–2.5)
ALBUMIN MSPROF: 4.1 g/dL (ref 3.6–5.1)
ALT: 13 U/L (ref 6–29)
AST: 18 U/L (ref 10–35)
Alkaline phosphatase (APISO): 73 U/L (ref 33–130)
BUN: 13 mg/dL (ref 7–25)
CHLORIDE: 100 mmol/L (ref 98–110)
CO2: 33 mmol/L — ABNORMAL HIGH (ref 20–32)
CREATININE: 0.89 mg/dL (ref 0.50–0.99)
Calcium: 9.5 mg/dL (ref 8.6–10.4)
GLOBULIN: 2.7 g/dL (ref 1.9–3.7)
GLUCOSE: 89 mg/dL (ref 65–99)
POTASSIUM: 3.6 mmol/L (ref 3.5–5.3)
Sodium: 140 mmol/L (ref 135–146)
Total Bilirubin: 0.7 mg/dL (ref 0.2–1.2)
Total Protein: 6.8 g/dL (ref 6.1–8.1)

## 2017-08-10 LAB — CBC WITH DIFFERENTIAL/PLATELET
BASOS PCT: 1.2 %
Basophils Absolute: 67 cells/uL (ref 0–200)
Eosinophils Absolute: 241 cells/uL (ref 15–500)
Eosinophils Relative: 4.3 %
HCT: 41.4 % (ref 35.0–45.0)
Hemoglobin: 13.9 g/dL (ref 11.7–15.5)
Lymphs Abs: 1786 cells/uL (ref 850–3900)
MCH: 29.7 pg (ref 27.0–33.0)
MCHC: 33.6 g/dL (ref 32.0–36.0)
MCV: 88.5 fL (ref 80.0–100.0)
MPV: 10.4 fL (ref 7.5–12.5)
Monocytes Relative: 9.6 %
Neutro Abs: 2968 cells/uL (ref 1500–7800)
Neutrophils Relative %: 53 %
PLATELETS: 263 10*3/uL (ref 140–400)
RBC: 4.68 10*6/uL (ref 3.80–5.10)
RDW: 13.8 % (ref 11.0–15.0)
TOTAL LYMPHOCYTE: 31.9 %
WBC mixed population: 538 cells/uL (ref 200–950)
WBC: 5.6 10*3/uL (ref 3.8–10.8)

## 2017-08-10 LAB — LIPID PANEL
CHOL/HDL RATIO: 3.3 (calc) (ref <5.0)
Cholesterol: 191 mg/dL (ref <200)
HDL: 58 mg/dL (ref >50)
LDL Cholesterol (Calc): 110 mg/dL (calc) — ABNORMAL HIGH
NON-HDL CHOLESTEROL (CALC): 133 mg/dL — AB (ref <130)
Triglycerides: 120 mg/dL (ref <150)

## 2017-08-10 MED ORDER — FLUOXETINE HCL 10 MG PO CAPS: 10.0000 mg | ORAL_CAPSULE | Freq: Every day | ORAL | 3 refills | Status: DC

## 2017-08-10 MED ORDER — HYDROCHLOROTHIAZIDE 50 MG PO TABS: 50.0000 mg | ORAL_TABLET | Freq: Every day | ORAL | 3 refills | Status: DC

## 2017-08-10 NOTE — Patient Instructions (Addendum)
Ask your GYN about Bone Density test with your mammogram  I recommend eye visit once a year I recommend dental visit every 6 months Goal is to  Exercise 30 minutes 5 days a week We will send a letter with lab results  F/U  1year or as needed

## 2017-08-10 NOTE — Progress Notes (Signed)
Subjective:   Patient presents for Medicare Annual/Subsequent preventive examination.    Medications reviewed   HTN- taking HCTZ as prescribed  Depression with anxiety- maintained on prozac  Taking vitamins daily- calcium vitamin D   Review Past Medical/Family/Social: per EMR   Risk Factors  Current exercise habits: Walks  Dietary issues discussed: she is working on weight loss, tried [employer], discussed increasing so not as strict on LCHF- she felt irritable on the diet   Cardiac risk factors: Obesity (BMI >= 30 kg/m2). HTN   Depression Screen  (Note: if answer to either of the following is "Yes", a more complete depression screening is indicated)  Over the past two weeks, have you felt down, depressed or hopeless? No Over the past two weeks, have you felt little interest or pleasure in doing things? No Have you lost interest or pleasure in daily life? No Do you often feel hopeless? No Do you cry easily over simple problems? No   Activities of Daily Living  In your present state of health, do you have any difficulty performing the following activities?:  Driving? No  Managing money? No  Feeding yourself? No  Getting from bed to chair? No  Climbing a flight of stairs? No  Preparing food and eating?: No  Bathing or showering? No  Getting dressed: No  Getting to the toilet? No  Using the toilet:No  Moving around from place to place: No  In the past year have you fallen or had a near fall?:No  Are you sexually active? yes Do you have more than one partner? No   Hearing Difficulties: No  Do you often ask people to speak up or repeat themselves? No  Do you experience ringing or noises in your ears? No Do you have difficulty understanding soft or whispered voices? No  Do you feel that you have a problem with memory? No Do you often misplace items? No  Do you feel safe at home? Yes  Cognitive Testing  Alert? Yes Normal Appearance?Yes  Oriented to person? Yes Place? Yes   Time? Yes  Recall of three objects? Yes  Can perform simple calculations? Yes  Displays appropriate judgment?Yes  Can read the correct time from a watch face?Yes   List the Names of Other Physician/Practitioners you currently use: GYN    Screening Tests / Date Colonoscopy    Due in  2020                  Zostavax - UTD Mammogram - Due in January, gets done in GYN Office  Bone Density- Due  Influenza Vaccine  Due  Tetanus/tdap UTD Pneumoonia-   ROS: GEN- denies fatigue, fever, weight loss,weakness, recent illness HEENT- denies eye drainage, change in vision, nasal discharge, CVS- denies chest pain, palpitations RESP- denies SOB, cough, wheeze ABD- denies N/V, change in stools, abd pain GU- denies dysuria, hematuria, dribbling, incontinence MSK- denies joint pain, muscle aches, injury Neuro- denies headache, dizziness, syncope, seizure activity  Physical:  GEN- NAD, alert and oriented x3 HEENT- PERRL, EOMI, non injected sclera, pink conjunctiva, MMM, oropharynx clear, TM clear bilat no effusion  Neck- Supple, no thryomegaly, no bruit CVS- RRR, no murmur RESP-CTAB ABD-NABS, Soft, NT, ND Psych- normal affect and mood  EXT- No edema Pulses- Radial, DP- 2+   Assessment:    Annual wellness medicare exam   Plan:    During the course of the visit the patient was educated and counseled about appropriate screening and preventive services including:  Screening mammography/Bone Density   - to be done at GYN Discussed shingrix- pt interested Flu shot and Prevnar 13 given  Fasting labs today   HTN- well controlled no changes  Depression with anxiety- controlled on low dose prozac   .  Diet review for nutrition referral? Yes ____ Not Indicated __x__  Patient Instructions (the written plan) was given to the patient.  Medicare Attestation  I have personally reviewed:  The patient's medical and social history  Their use of alcohol, tobacco or illicit drugs  Their current  medications and supplements  The patient's functional ability including ADLs,fall risks, home safety risks, cognitive, and hearing and visual impairment  Diet and physical activities  Evidence for depression or mood disorders  The patient's weight, height, BMI, and visual acuity have been recorded in the chart. I have made referrals, counseling, and provided education to the patient based on review of the above and I have provided the patient with a written personalized care plan for preventive services.

## 2017-08-13 ENCOUNTER — Encounter: Payer: Self-pay | Admitting: *Deleted

## 2017-09-17 DIAGNOSIS — W57XXXA Bitten or stung by nonvenomous insect and other nonvenomous arthropods, initial encounter: Secondary | ICD-10-CM | POA: Diagnosis not present

## 2017-09-17 DIAGNOSIS — Z800 Family history of malignant neoplasm of digestive organs: Secondary | ICD-10-CM | POA: Diagnosis not present

## 2017-09-17 DIAGNOSIS — Z85828 Personal history of other malignant neoplasm of skin: Secondary | ICD-10-CM | POA: Diagnosis not present

## 2017-09-17 DIAGNOSIS — Z808 Family history of malignant neoplasm of other organs or systems: Secondary | ICD-10-CM | POA: Diagnosis not present

## 2017-09-17 DIAGNOSIS — Z86018 Personal history of other benign neoplasm: Secondary | ICD-10-CM | POA: Diagnosis not present

## 2017-09-17 DIAGNOSIS — L719 Rosacea, unspecified: Secondary | ICD-10-CM | POA: Diagnosis not present

## 2017-09-17 DIAGNOSIS — S50861A Insect bite (nonvenomous) of right forearm, initial encounter: Secondary | ICD-10-CM | POA: Diagnosis not present

## 2017-09-17 DIAGNOSIS — Z23 Encounter for immunization: Secondary | ICD-10-CM | POA: Diagnosis not present

## 2017-10-06 ENCOUNTER — Encounter: Payer: Self-pay | Admitting: Family Medicine

## 2017-10-21 ENCOUNTER — Encounter: Payer: Self-pay | Admitting: Family Medicine

## 2018-01-07 DIAGNOSIS — Z124 Encounter for screening for malignant neoplasm of cervix: Secondary | ICD-10-CM | POA: Diagnosis not present

## 2018-01-07 DIAGNOSIS — Z1231 Encounter for screening mammogram for malignant neoplasm of breast: Secondary | ICD-10-CM | POA: Diagnosis not present

## 2018-01-07 LAB — HM MAMMOGRAPHY

## 2018-01-11 LAB — HM PAP SMEAR: HM PAP: NEGATIVE

## 2018-08-12 ENCOUNTER — Other Ambulatory Visit: Payer: Self-pay

## 2018-08-12 ENCOUNTER — Encounter: Payer: Self-pay | Admitting: Family Medicine

## 2018-08-12 ENCOUNTER — Ambulatory Visit (INDEPENDENT_AMBULATORY_CARE_PROVIDER_SITE_OTHER): Payer: Medicare Other | Admitting: Family Medicine

## 2018-08-12 VITALS — BP 118/64 | HR 68 | Temp 97.9°F | Resp 14 | Ht 61.0 in | Wt 179.0 lb

## 2018-08-12 DIAGNOSIS — Z1159 Encounter for screening for other viral diseases: Secondary | ICD-10-CM

## 2018-08-12 DIAGNOSIS — F418 Other specified anxiety disorders: Secondary | ICD-10-CM

## 2018-08-12 DIAGNOSIS — E785 Hyperlipidemia, unspecified: Secondary | ICD-10-CM

## 2018-08-12 DIAGNOSIS — Z6833 Body mass index (BMI) 33.0-33.9, adult: Secondary | ICD-10-CM

## 2018-08-12 DIAGNOSIS — Z23 Encounter for immunization: Secondary | ICD-10-CM

## 2018-08-12 DIAGNOSIS — I10 Essential (primary) hypertension: Secondary | ICD-10-CM

## 2018-08-12 DIAGNOSIS — Z0000 Encounter for general adult medical examination without abnormal findings: Secondary | ICD-10-CM | POA: Diagnosis not present

## 2018-08-12 DIAGNOSIS — E669 Obesity, unspecified: Secondary | ICD-10-CM

## 2018-08-12 MED ORDER — HYDROCHLOROTHIAZIDE 50 MG PO TABS: 50.0000 mg | ORAL_TABLET | Freq: Every day | ORAL | 3 refills | Status: DC

## 2018-08-12 NOTE — Patient Instructions (Addendum)
Laura [name] , Thank you for taking time to come for your Medicare Wellness Visit. I appreciate your ongoing commitment to your health goals. Please review the following plan we discussed and let me know if I can assist you in the future.   These are the goals we discussed: Goals   None     This is a list of the screening recommended for you and due dates:  Health Maintenance  Topic Date Due  . Colon Cancer Screening  08/01/2002  . Mammogram  04/20/2015  . DEXA scan (bone density measurement)  08/01/2017  . Flu Shot  05/20/2018  . Pneumonia vaccines (2 of 2 - PPSV23) 08/10/2018  .  Hepatitis C: One time screening is recommended by Center for Disease Control  (CDC) for  adults born from [birth year] through 1964.   10/20/2048*  . Tetanus Vaccine  10/21/2023  *Topic was postponed. The date shown is not the original due date.    Fat and Cholesterol Restricted Diet Getting too much fat and cholesterol in your diet may cause health problems. Following this diet helps keep your fat and cholesterol at normal levels. This can keep you from getting sick. What types of fat should I choose?  Choose monosaturated and polyunsaturated fats. These are found in foods such as olive oil, canola oil, flaxseeds, walnuts, almonds, and seeds.  Eat more omega-3 fats. Good choices include salmon, mackerel, sardines, tuna, flaxseed oil, and ground flaxseeds.  Limit saturated fats. These are in animal products such as meats, butter, and cream. They can also be in plant products such as palm oil, palm kernel oil, and coconut oil.  Avoid foods with partially hydrogenated oils in them. These contain trans fats. Examples of foods that have trans fats are stick margarine, some tub margarines, cookies, crackers, and other baked goods. What general guidelines do I need to follow?  Check food labels. Look for the words "trans fat" and "saturated fat."  When preparing a meal: ? Fill half of your plate with vegetables and  green salads. ? Fill one fourth of your plate with whole grains. Look for the word "whole" as the first word in the ingredient list. ? Fill one fourth of your plate with lean protein foods.  Eat more foods that have fiber, like apples, carrots, beans, peas, and barley.  Eat more home-cooked foods. Eat less at restaurants and buffets.  Limit or avoid alcohol.  Limit foods high in starch and sugar.  Limit fried foods.  Cook foods without frying them. Baking, boiling, grilling, and broiling are all great options.  Lose weight if you are overweight. Losing even a small amount of weight can help your overall health. It can also help prevent diseases such as diabetes and heart disease. What foods can I eat? Grains Whole grains, such as whole wheat or whole grain breads, crackers, cereals, and pasta. Unsweetened oatmeal, bulgur, barley, quinoa, or brown rice. Corn or whole wheat flour tortillas. Vegetables Fresh or frozen vegetables (raw, steamed, roasted, or grilled). Green salads. Fruits All fresh, canned (in natural juice), or frozen fruits. Meat and Other Protein Products Ground beef (85% or leaner), grass-fed beef, or beef trimmed of fat. Skinless chicken or [place]. Ground chicken or [place]. Pork trimmed of fat. All fish and seafood. Eggs. Dried beans, peas, or lentils. Unsalted nuts or seeds. Unsalted canned or dry beans. Dairy Low-fat dairy products, such as skim or 1% milk, 2% or reduced-fat cheeses, low-fat ricotta or cottage cheese, or plain low-fat yogurt.  Fats and Oils Tub margarines without trans fats. Light or reduced-fat mayonnaise and salad dressings. Avocado. Olive, canola, sesame, or safflower oils. Natural peanut or almond butter (choose ones without added sugar and oil). The items listed above may not be a complete list of recommended foods or beverages. Contact your dietitian for more options. What foods are not recommended? Grains White bread. White pasta. White rice.  Cornbread. Bagels, pastries, and croissants. Crackers that contain trans fat. Vegetables White potatoes. Corn. Creamed or fried vegetables. Vegetables in a cheese sauce. Fruits Dried fruits. Canned fruit in light or heavy syrup. Fruit juice. Meat and Other Protein Products Fatty cuts of meat. Ribs, chicken wings, bacon, sausage, bologna, salami, chitterlings, fatback, hot dogs, bratwurst, and packaged luncheon meats. Liver and organ meats. Dairy Whole or 2% milk, cream, half-and-half, and cream cheese. Whole milk cheeses. Whole-fat or sweetened yogurt. Full-fat cheeses. Nondairy creamers and whipped toppings. Processed cheese, cheese spreads, or cheese curds. Sweets and Desserts Corn syrup, sugars, honey, and molasses. Candy. Jam and jelly. Syrup. Sweetened cereals. Cookies, pies, cakes, donuts, muffins, and ice cream. Fats and Oils Butter, stick margarine, lard, shortening, ghee, or bacon fat. Coconut, palm kernel, or palm oils. Beverages Alcohol. Sweetened drinks (such as sodas, lemonade, and fruit drinks or punches). The items listed above may not be a complete list of foods and beverages to avoid. Contact your dietitian for more information. This information is not intended to replace advice given to you by your health care provider. Make sure you discuss any questions you have with your health care provider. Document Released: 04/06/2012 Document Revised: 06/12/2016 Document Reviewed: 01/05/2014 Elsevier Interactive Patient Education  2018 [place] Maintenance, Female Adopting a healthy lifestyle and getting preventive care can go a long way to promote health and wellness. Talk with your health care provider about what schedule of regular examinations is right for you. This is a good chance for you to check in with your provider about disease prevention and staying healthy. In between checkups, there are plenty of things you can do on your own. Experts have done a lot of  research about which lifestyle changes and preventive measures are most likely to keep you healthy. Ask your health care provider for more information. Weight and diet Eat a healthy diet  Be sure to include plenty of vegetables, fruits, low-fat dairy products, and lean protein.  Do not eat a lot of foods high in solid fats, added sugars, or salt.  Get regular exercise. This is one of the most important things you can do for your health. ? Most adults should exercise for at least 150 minutes each week. The exercise should increase your heart rate and make you sweat (moderate-intensity exercise). ? Most adults should also do strengthening exercises at least twice a week. This is in addition to the moderate-intensity exercise.  Maintain a healthy weight  Body mass index (BMI) is a measurement that can be used to identify possible weight problems. It estimates body fat based on height and weight. Your health care provider can help determine your BMI and help you achieve or maintain a healthy weight.  For females 58 years of age and older: ? A BMI below 18.5 is considered underweight. ? A BMI of 18.5 to 24.9 is normal. ? A BMI of 25 to 29.9 is considered overweight. ? A BMI of 30 and above is considered obese.  Watch levels of cholesterol and blood lipids  You should start having your blood tested  for lipids and cholesterol at 66 years of age, then have this test every 5 years.  You may need to have your cholesterol levels checked more often if: ? Your lipid or cholesterol levels are high. ? You are older than 66 years of age. ? You are at high risk for heart disease.  Cancer screening Lung Cancer  Lung cancer screening is recommended for adults 53-43 years old who are at high risk for lung cancer because of a history of smoking.  A yearly low-dose CT scan of the lungs is recommended for people who: ? Currently smoke. ? Have quit within the past 15 years. ? Have at least a  30-pack-year history of smoking. A pack year is smoking an average of one pack of cigarettes a day for 1 year.  Yearly screening should continue until it has been 15 years since you quit.  Yearly screening should stop if you develop a health problem that would prevent you from having lung cancer treatment.  Breast Cancer  Practice breast self-awareness. This means understanding how your breasts normally appear and feel.  It also means doing regular breast self-exams. Let your health care provider know about any changes, no matter how small.  If you are in your 20s or 30s, you should have a clinical breast exam (CBE) by a health care provider every 1-3 years as part of a regular health exam.  If you are 10 or older, have a CBE every year. Also consider having a breast X-ray (mammogram) every year.  If you have a family history of breast cancer, talk to your health care provider about genetic screening.  If you are at high risk for breast cancer, talk to your health care provider about having an MRI and a mammogram every year.  Breast cancer gene (BRCA) assessment is recommended for women who have family members with BRCA-related cancers. BRCA-related cancers include: ? Breast. ? Ovarian. ? Tubal. ? Peritoneal cancers.  Results of the assessment will determine the need for genetic counseling and BRCA1 and BRCA2 testing.  Cervical Cancer Your health care provider may recommend that you be screened regularly for cancer of the pelvic organs (ovaries, uterus, and vagina). This screening involves a pelvic examination, including checking for microscopic changes to the surface of your cervix (Pap test). You may be encouraged to have this screening done every 3 years, beginning at age 59.  For women ages 73-65, health care providers may recommend pelvic exams and Pap testing every 3 years, or they may recommend the Pap and pelvic exam, combined with testing for human papilloma virus (HPV), every  5 years. Some types of HPV increase your risk of cervical cancer. Testing for HPV may also be done on women of any age with unclear Pap test results.  Other health care providers may not recommend any screening for nonpregnant women who are considered low risk for pelvic cancer and who do not have symptoms. Ask your health care provider if a screening pelvic exam is right for you.  If you have had past treatment for cervical cancer or a condition that could lead to cancer, you need Pap tests and screening for cancer for at least 20 years after your treatment. If Pap tests have been discontinued, your risk factors (such as having a new sexual partner) need to be reassessed to determine if screening should resume. Some women have medical problems that increase the chance of getting cervical cancer. In these cases, your health care provider may recommend  more frequent screening and Pap tests.  Colorectal Cancer  This type of cancer can be detected and often prevented.  Routine colorectal cancer screening usually begins at 66 years of age and continues through 66 years of age.  Your health care provider may recommend screening at an earlier age if you have risk factors for colon cancer.  Your health care provider may also recommend using home test kits to check for hidden blood in the stool.  A small camera at the end of a tube can be used to examine your colon directly (sigmoidoscopy or colonoscopy). This is done to check for the earliest forms of colorectal cancer.  Routine screening usually begins at age 23.  Direct examination of the colon should be repeated every 5-10 years through 66 years of age. However, you may need to be screened more often if early forms of precancerous polyps or small growths are found.  Skin Cancer  Check your skin from head to toe regularly.  Tell your health care provider about any new moles or changes in moles, especially if there is a change in a mole's shape  or color.  Also tell your health care provider if you have a mole that is larger than the size of a pencil eraser.  Always use sunscreen. Apply sunscreen liberally and repeatedly throughout the day.  Protect yourself by wearing long sleeves, pants, a wide-brimmed hat, and sunglasses whenever you are outside.  Heart disease, diabetes, and high blood pressure  High blood pressure causes heart disease and increases the risk of stroke. High blood pressure is more likely to develop in: ? People who have blood pressure in the high end of the normal range (130-139/85-89 mm Hg). ? People who are overweight or obese. ? People who are African American.  If you are 58-6 years of age, have your blood pressure checked every 3-5 years. If you are 51 years of age or older, have your blood pressure checked every year. You should have your blood pressure measured twice-once when you are at a hospital or clinic, and once when you are not at a hospital or clinic. Record the average of the two measurements. To check your blood pressure when you are not at a hospital or clinic, you can use: ? An automated blood pressure machine at a pharmacy. ? A home blood pressure monitor.  If you are between 55 years and 51 years old, ask your health care provider if you should take aspirin to prevent strokes.  Have regular diabetes screenings. This involves taking a blood sample to check your fasting blood sugar level. ? If you are at a normal weight and have a low risk for diabetes, have this test once every three years after 66 years of age. ? If you are overweight and have a high risk for diabetes, consider being tested at a younger age or more often. Preventing infection Hepatitis B  If you have a higher risk for hepatitis B, you should be screened for this virus. You are considered at high risk for hepatitis B if: ? You were born in a country where hepatitis B is common. Ask your health care provider which countries  are considered high risk. ? Your parents were born in a high-risk country, and you have not been immunized against hepatitis B (hepatitis B vaccine). ? You have HIV or AIDS. ? You use needles to inject street drugs. ? You live with someone who has hepatitis B. ? You have had sex  with someone who has hepatitis B. ? You get hemodialysis treatment. ? You take certain medicines for conditions, including cancer, organ transplantation, and autoimmune conditions.  Hepatitis C  Blood testing is recommended for: ? Everyone born from [birth year] through 1964. ? Anyone with known risk factors for hepatitis C.  Sexually transmitted infections (STIs)  You should be screened for sexually transmitted infections (STIs) including gonorrhea and chlamydia if: ? You are sexually active and are younger than 66 years of age. ? You are older than 65 years of age and your health care provider tells you that you are at risk for this type of infection. ? Your sexual activity has changed since you were last screened and you are at an increased risk for chlamydia or gonorrhea. Ask your health care provider if you are at risk.  If you do not have HIV, but are at risk, it may be recommended that you take a prescription medicine daily to prevent HIV infection. This is called pre-exposure prophylaxis (PrEP). You are considered at risk if: ? You are sexually active and do not regularly use condoms or know the HIV status of your partner(s). ? You take drugs by injection. ? You are sexually active with a partner who has HIV.  Talk with your health care provider about whether you are at high risk of being infected with HIV. If you choose to begin PrEP, you should first be tested for HIV. You should then be tested every 3 months for as long as you are taking PrEP. Pregnancy  If you are premenopausal and you may become pregnant, ask your health care provider about preconception counseling.  If you may become pregnant, take 400  to 800 micrograms (mcg) of folic acid every day.  If you want to prevent pregnancy, talk to your health care provider about birth control (contraception). Osteoporosis and menopause  Osteoporosis is a disease in which the bones lose minerals and strength with aging. This can result in serious bone fractures. Your risk for osteoporosis can be identified using a bone density scan.  If you are 75 years of age or older, or if you are at risk for osteoporosis and fractures, ask your health care provider if you should be screened.  Ask your health care provider whether you should take a calcium or vitamin D supplement to lower your risk for osteoporosis.  Menopause may have certain physical symptoms and risks.  Hormone replacement therapy may reduce some of these symptoms and risks. Talk to your health care provider about whether hormone replacement therapy is right for you. Follow these instructions at home:  Schedule regular health, dental, and eye exams.  Stay current with your immunizations.  Do not use any tobacco products including cigarettes, chewing tobacco, or electronic cigarettes.  If you are pregnant, do not drink alcohol.  If you are breastfeeding, limit how much and how often you drink alcohol.  Limit alcohol intake to no more than 1 drink per day for nonpregnant women. One drink equals 12 ounces of beer, 5 ounces of wine, or 1 ounces of hard liquor.  Do not use street drugs.  Do not share needles.  Ask your health care provider for help if you need support or information about quitting drugs.  Tell your health care provider if you often feel depressed.  Tell your health care provider if you have ever been abused or do not feel safe at home. This information is not intended to replace advice given to you by  your health care provider. Make sure you discuss any questions you have with your health care provider. Document Released: 04/21/2011 Document Revised: 03/13/2016  Document Reviewed: 07/10/2015 Elsevier Interactive Patient Education  [employer].

## 2018-08-12 NOTE — Assessment & Plan Note (Signed)
Well controlled, no sx, no med SE, recheck CMP, continue HCTZ, refills sent

## 2018-08-12 NOTE — Assessment & Plan Note (Signed)
Well controlled with prozac 10 mg Screening done, negative

## 2018-08-12 NOTE — Progress Notes (Signed)
Subjective:   Laura [name] is a 67 y.o. female who presents for Medicare Annual (Subsequent) preventive examination.  She has HTN, on HCTZ 50 mg, no SE, no sx - denies HA, CP, SOB, LE edema - usually seen once a year by her PCP - Dr. Buelah Manis and by Harle Battiest at [facility]  Depression -earlier this year in March 2019 she did see her OB/GYN and at that time was feeling well and stopped taking Prozac however after about a month she began to feel slightly ill and agitated and she restarted the medication and usually takes it at night, she feels well again, has no depression symptoms, no anxiety symptoms, she sleeps well.  She notes that she does not like having to take any medicines and especially taking her depression medicine such as Prozac she feels like it is a weakness or crutch but she has tried to stop taking it a couple times before and feels like she unfortunately has to continue to take it.  Patient's last colonoscopy was done in September 2015 she is due for it in 2020.  She states that she sees Dr. Oletta Lamas and that they already have arranged for next year.    She is due for DEXA scan, but had mammogram and Pap done -having is normal, obtaining records  Patient agrees to get hep C screening done today  She would like to lose weight but she feels fine for the day and does not regularly exercise and has some difficulty fitting in any type of exercise because of weather too hot or raining.  Feels like she should start to try and exercise, but admits she does not have a ton of motivation.  Due for vaccine and flu today  Requests refills on her hydrochlorothiazide  She looked into getting Shingrix vaccines last year but she has decided that she does not want to do them.  Previously had elevated LDL's, she is not on any medication for this.   Review of Systems:  Review of Systems  Constitutional: Negative.  Negative for activity change, appetite change, fatigue and unexpected weight  change.  HENT: Negative.   Eyes: Negative.   Respiratory: Negative.  Negative for shortness of breath.   Cardiovascular: Negative.  Negative for chest pain, palpitations and leg swelling.  Gastrointestinal: Negative.  Negative for abdominal pain and blood in stool.  Endocrine: Negative.   Genitourinary: Negative.   Musculoskeletal: Negative.  Negative for arthralgias, gait problem, joint swelling and myalgias.  Skin: Negative.  Negative for color change, pallor and rash.  Allergic/Immunologic: Negative.   Neurological: Negative.  Negative for syncope and weakness.  Hematological: Negative.   Psychiatric/Behavioral: Negative.  Negative for confusion, dysphoric mood, self-injury and suicidal ideas. The patient is not nervous/anxious.    Cardiac Risk Factors include: advanced age (>22men, >16 women);dyslipidemia;hypertension;obesity (BMI >30kg/m2)     Objective:     Vitals: BP 118/64   Pulse 68   Temp 97.9 F (36.6 C) (Oral)   Resp 14   Ht 5\' 1"  (1.549 m)   Wt 179 lb (81.2 kg)   SpO2 98%   BMI 33.82 kg/m   Body mass index is 33.82 kg/m.  Advanced Directives 08/12/2018 07/04/2014  Does Patient Have a Medical Advance Directive? No No  Would patient like information on creating a medical advance directive? Yes (MAU/Ambulatory/Procedural Areas - Information given) -    Tobacco Social History   Tobacco Use  Smoking Status Never Smoker  Smokeless Tobacco Never Used  Counseling given: Not Answered   Clinical Intake:  Pre-visit preparation completed: Yes  Pain : No/denies pain     BMI - recorded: 33 Nutritional Status: BMI > 30  Obese Nutritional Risks: None Diabetes: No  How often do you need to have someone help you when you read instructions, pamphlets, or other written materials from your doctor or pharmacy?: 1 - Never What is the last grade level you completed in school?: high school graduate  Interpreter Needed?: No  Information entered by :: [employer],  LPN  Past Medical History:  Diagnosis Date  . Chest pain   . Depression   . Hypertension   . Normal cardiac stress test    Past Surgical History:  Procedure Laterality Date  . SPINE SURGERY     herniated disc   Family History  Problem Relation Age of Onset  . Cancer Mother   . Hypertension Mother   . Alcohol abuse Father   . Cancer Father        Pancreatic  . Diabetes Father   . Hypertension Brother   . Depression Sister   . Mental illness Sister    Social History   Socioeconomic History  . Marital status: Married    Spouse name: Not on file  . Number of children: Not on file  . Years of education: Not on file  . Highest education level: Not on file  Occupational History  . Not on file  Social Needs  . Financial resource strain: Not on file  . Food insecurity:    Worry: Not on file    Inability: Not on file  . Transportation needs:    Medical: Not on file    Non-medical: Not on file  Tobacco Use  . Smoking status: Never Smoker  . Smokeless tobacco: Never Used  Substance and Sexual Activity  . Alcohol use: Yes    Comment: occasionally  . Drug use: No  . Sexual activity: Yes  Lifestyle  . Physical activity:    Days per week: Not on file    Minutes per session: Not on file  . Stress: Not on file  Relationships  . Social connections:    Talks on phone: Not on file    Gets together: Not on file    Attends religious service: Not on file    Active member of club or organization: Not on file    Attends meetings of clubs or organizations: Not on file    Relationship status: Not on file  Other Topics Concern  . Not on file  Social History Narrative  . Not on file    Outpatient Encounter Medications as of 08/12/2018  Medication Sig  . COLLAGEN PO Take by mouth.  Marland Kitchen FLUoxetine (PROZAC) 10 MG capsule Take 1 capsule (10 mg total) by mouth daily.  . hydrochlorothiazide (HYDRODIURIL) 50 MG tablet Take 1 tablet (50 mg total) by mouth daily.  . [DISCONTINUED]  hydrochlorothiazide (HYDRODIURIL) 50 MG tablet Take 1 tablet (50 mg total) by mouth daily.  . [DISCONTINUED] Biotin (BIOTIN 5000) 5 MG CAPS Take 1 capsule by mouth daily.  . [DISCONTINUED] Calcium Carbonate-Vitamin D (CALTRATE 600+D PO) Take 1 tablet by mouth 2 (two) times daily.   No facility-administered encounter medications on file as of 08/12/2018.     Activities of Daily Living In your present state of health, do you have any difficulty performing the following activities: 08/12/2018  Hearing? N  Vision? N  Difficulty concentrating or making decisions? N  Walking  or climbing stairs? N  Dressing or bathing? N  Doing errands, shopping? N  Preparing Food and eating ? N  Using the Toilet? N  In the past six months, have you accidently leaked urine? N  Do you have problems with loss of bowel control? N  Managing your Medications? N  Managing your Finances? N  Housekeeping or managing your Housekeeping? N  Some recent data might be hidden    Patient Care Team: [facility], Modena Nunnery, MD as PCP - General (Family Medicine)  [facility]    Assessment:   This is a routine wellness examination for Laura [name].  Exercise Activities and Dietary recommendations Current Exercise Habits: The patient does not participate in regular exercise at present, Exercise limited by: None identified  Goals    . Exercise 3x per week (30 min per time)       Fall Risk Fall Risk  08/12/2018 08/10/2017 07/23/2016 07/16/2015  Falls in the past year? No No No No   Is the patient's home free of loose throw rugs in walkways, pet beds, electrical cords, etc?   yes      Grab bars in the bathroom? yes      Handrails on the stairs?   yes      Adequate lighting?   yes   Depression Screen PHQ 2/9 Scores 08/12/2018 08/10/2017 07/23/2016 07/16/2015  PHQ - 2 Score 0 0 0 0  PHQ- 9 Score - - 1 0     Cognitive Function  Cognitive Testing   Alert? Yes  Normal Appearance?Yes  Oriented to  person? Yes  Place? Yes  Time? Yes  Recall of three objects? Yes  Can perform simple calculations? Yes  Displays appropriate judgment?Yes  Can read the correct time from a watch face?Yes          Immunization History  Administered Date(s) Administered  . Influenza, High Dose Seasonal PF 08/10/2017, 08/12/2018  . Influenza,inj,Quad PF,6+ Mos 07/16/2015, 07/23/2016  . Influenza-Unspecified 07/20/2012, 06/21/2015  . Pneumococcal Conjugate-13 08/10/2017  . Pneumococcal Polysaccharide-23 08/12/2018  . Tdap 01/18/2010, 03/07/2010  . Zoster 07/23/2016    Qualifies for Shingles Vaccine? - pt not interested in getting  Screening Tests Health Maintenance  Topic Date Due  . DEXA SCAN  08/01/2017  . PNA vac Low Risk Adult (2 of 2 - PPSV23) 08/10/2018  . COLONOSCOPY  06/21/2019 (Originally 08/01/2002)  . MAMMOGRAM  12/19/2019  . TETANUS/TDAP  10/21/2023  . INFLUENZA VACCINE  Completed  . Hepatitis C Screening  Completed    Cancer Screenings: Lung: Low Dose CT Chest recommended if Age 89-80 years, 30 pack-year currently smoking OR have quit w/in 15years. Patient does not qualify. Breast:  Up to date on Mammogram? Yes   Up to date of Bone Density/Dexa? Yes Colorectal: due 2020  Additional Screenings: will do today Hepatitis C Screening:      Plan:    Problem List Items Addressed This Visit      Cardiovascular and Mediastinum   Essential hypertension    Well controlled, no sx, no med SE, recheck CMP, continue HCTZ, refills sent      Relevant Medications   hydrochlorothiazide (HYDRODIURIL) 50 MG tablet   Other Relevant Orders   CBC with Differential/Platelet   COMPLETE METABOLIC PANEL WITH GFR   Lipid panel     Other   Depression with anxiety    Well controlled with prozac 10 mg Screening done, negative       Other Visit  Diagnoses    Need for prophylactic vaccination and inoculation against influenza    -  Primary   Relevant Orders   Flu vaccine HIGH DOSE PF  (Completed)   Encounter for immunization       Relevant Orders   Flu vaccine HIGH DOSE PF (Completed)   Need for vaccination for Strep pneumoniae       Relevant Orders   Pneumococcal polysaccharide vaccine 23-valent greater than or equal to 2yo subcutaneous/IM (Completed)   Class 1 obesity with body mass index (BMI) of 33.0 to 33.9 in adult, unspecified obesity type, unspecified whether serious comorbidity present       Relevant Orders   COMPLETE METABOLIC PANEL WITH GFR   Lipid panel   Hyperlipidemia, unspecified hyperlipidemia type       Relevant Medications   hydrochlorothiazide (HYDRODIURIL) 50 MG tablet   Other Relevant Orders   CBC with Differential/Platelet   COMPLETE METABOLIC PANEL WITH GFR   Lipid panel   Encounter for Medicare annual wellness exam       Need for hepatitis C screening test       Relevant Orders   Hepatitis C antibody        I have personally reviewed and noted the following in the patient's chart:   . Medical and social history . Use of alcohol, tobacco or illicit drugs  . Current medications and supplements . Functional ability and status . Nutritional status . Physical activity . Advanced directives . List of other physicians . Hospitalizations, surgeries, and ER visits in previous 12 months . Vitals . Screenings to include cognitive, depression, and falls . Referrals and appointments  In addition, I have reviewed and discussed with patient certain preventive protocols, quality metrics, and best practice recommendations. A written personalized care plan for preventive services as well as general preventive health recommendations were provided to patient.     Delsa Grana, PA-C  08/12/2018

## 2018-08-13 ENCOUNTER — Encounter: Payer: Self-pay | Admitting: *Deleted

## 2018-08-13 LAB — CBC WITH DIFFERENTIAL/PLATELET
BASOS ABS: 72 {cells}/uL (ref 0–200)
BASOS PCT: 1 %
EOS ABS: 158 {cells}/uL (ref 15–500)
Eosinophils Relative: 2.2 %
HCT: 42.8 % (ref 35.0–45.0)
Hemoglobin: 14.4 g/dL (ref 11.7–15.5)
Lymphs Abs: 2398 cells/uL (ref 850–3900)
MCH: 29.3 pg (ref 27.0–33.0)
MCHC: 33.6 g/dL (ref 32.0–36.0)
MCV: 87.2 fL (ref 80.0–100.0)
MONOS PCT: 8.8 %
MPV: 10.9 fL (ref 7.5–12.5)
NEUTROS PCT: 54.7 %
Neutro Abs: 3938 cells/uL (ref 1500–7800)
PLATELETS: 302 10*3/uL (ref 140–400)
RBC: 4.91 10*6/uL (ref 3.80–5.10)
RDW: 13.2 % (ref 11.0–15.0)
TOTAL LYMPHOCYTE: 33.3 %
WBC: 7.2 10*3/uL (ref 3.8–10.8)
WBCMIX: 634 {cells}/uL (ref 200–950)

## 2018-08-13 LAB — COMPLETE METABOLIC PANEL WITH GFR
AG RATIO: 1.5 (calc) (ref 1.0–2.5)
ALT: 15 U/L (ref 6–29)
AST: 19 U/L (ref 10–35)
Albumin: 4.4 g/dL (ref 3.6–5.1)
Alkaline phosphatase (APISO): 86 U/L (ref 33–130)
BILIRUBIN TOTAL: 0.5 mg/dL (ref 0.2–1.2)
BUN: 15 mg/dL (ref 7–25)
CALCIUM: 10.1 mg/dL (ref 8.6–10.4)
CHLORIDE: 99 mmol/L (ref 98–110)
CO2: 28 mmol/L (ref 20–32)
Creat: 0.91 mg/dL (ref 0.50–0.99)
GFR, EST AFRICAN AMERICAN: 76 mL/min/{1.73_m2} (ref >=60)
GFR, EST NON AFRICAN AMERICAN: 66 mL/min/{1.73_m2} (ref >=60)
GLOBULIN: 3 g/dL (ref 1.9–3.7)
Glucose, Bld: 89 mg/dL (ref 65–99)
POTASSIUM: 4.1 mmol/L (ref 3.5–5.3)
SODIUM: 139 mmol/L (ref 135–146)
TOTAL PROTEIN: 7.4 g/dL (ref 6.1–8.1)

## 2018-08-13 LAB — LIPID PANEL
Cholesterol: 218 mg/dL — ABNORMAL HIGH (ref <200)
HDL: 59 mg/dL (ref >50)
LDL Cholesterol (Calc): 137 mg/dL (calc) — ABNORMAL HIGH
Non-HDL Cholesterol (Calc): 159 mg/dL (calc) — ABNORMAL HIGH (ref <130)
Total CHOL/HDL Ratio: 3.7 (calc) (ref <5.0)
Triglycerides: 114 mg/dL (ref <150)

## 2018-08-13 LAB — HEPATITIS C ANTIBODY
Hepatitis C Ab: NONREACTIVE
SIGNAL TO CUT-OFF: 0.04 (ref <1.00)

## 2018-08-13 NOTE — Addendum Note (Signed)
Addended by: Delsa Grana on: 08/13/2018 02:31 PM   Modules accepted: Orders

## 2018-10-06 DIAGNOSIS — D225 Melanocytic nevi of trunk: Secondary | ICD-10-CM | POA: Diagnosis not present

## 2018-10-06 DIAGNOSIS — Z800 Family history of malignant neoplasm of digestive organs: Secondary | ICD-10-CM | POA: Diagnosis not present

## 2018-10-06 DIAGNOSIS — Z85828 Personal history of other malignant neoplasm of skin: Secondary | ICD-10-CM | POA: Diagnosis not present

## 2018-10-06 DIAGNOSIS — L570 Actinic keratosis: Secondary | ICD-10-CM | POA: Diagnosis not present

## 2018-10-06 DIAGNOSIS — L821 Other seborrheic keratosis: Secondary | ICD-10-CM | POA: Diagnosis not present

## 2018-10-06 DIAGNOSIS — Z86018 Personal history of other benign neoplasm: Secondary | ICD-10-CM | POA: Diagnosis not present

## 2018-10-06 DIAGNOSIS — L719 Rosacea, unspecified: Secondary | ICD-10-CM | POA: Diagnosis not present

## 2018-10-06 DIAGNOSIS — Z23 Encounter for immunization: Secondary | ICD-10-CM | POA: Diagnosis not present

## 2018-10-06 DIAGNOSIS — Z808 Family history of malignant neoplasm of other organs or systems: Secondary | ICD-10-CM | POA: Diagnosis not present

## 2018-10-07 ENCOUNTER — Ambulatory Visit (INDEPENDENT_AMBULATORY_CARE_PROVIDER_SITE_OTHER): Payer: Medicare Other | Admitting: Family Medicine

## 2018-10-07 ENCOUNTER — Encounter: Payer: Self-pay | Admitting: Family Medicine

## 2018-10-07 VITALS — BP 120/80 | HR 68 | Temp 98.2°F | Resp 17 | Ht 61.0 in | Wt 178.2 lb

## 2018-10-07 DIAGNOSIS — J209 Acute bronchitis, unspecified: Secondary | ICD-10-CM | POA: Diagnosis not present

## 2018-10-07 MED ORDER — AZITHROMYCIN 250 MG PO TABS: ORAL_TABLET | ORAL | 0 refills | Status: DC

## 2018-10-07 MED ORDER — BENZONATATE 100 MG PO CAPS: 100.0000 mg | ORAL_CAPSULE | Freq: Three times a day (TID) | ORAL | 0 refills | Status: DC | PRN

## 2018-10-07 MED ORDER — IPRATROPIUM-ALBUTEROL 0.5-2.5 (3) MG/3ML IN SOLN
3.0000 mL | Freq: Once | RESPIRATORY_TRACT | Status: AC
  Administered 2018-10-07: 3 mL via RESPIRATORY_TRACT

## 2018-10-07 MED ORDER — PREDNISONE 20 MG PO TABS: 40.0000 mg | ORAL_TABLET | Freq: Every day | ORAL | 0 refills | Status: AC

## 2018-10-07 MED ORDER — ALBUTEROL SULFATE HFA 108 (90 BASE) MCG/ACT IN AERS: 2.0000 | INHALATION_SPRAY | RESPIRATORY_TRACT | 0 refills | Status: DC | PRN

## 2018-10-07 NOTE — Progress Notes (Signed)
Patient ID: Laura [name], female    DOB: 01/29/1951, 67 y.o.   MRN: [id number]  PCP: Alycia Rossetti, MD  Chief Complaint  Patient presents with  . Cough    Patient in today with c/o productive cough and yellow sputum, night sweats, and sore throats. Onset of symptoms saturday.     Subjective:   Laura [name] is a 67 y.o. female, presents to clinic with CC of 4 days of URI symptoms, sore throat, productive cough, seems to be getting gradually worse.  She has productive cough with yellow sputum, some exertional shortness of breath and sometimes cough feels wheezy but she denies any chest pain.  She has had night sweats and does feel generally fatigued.  No known fever.  Sore throat is mild, scratchy she believes related to the frequent harsh coughing that is become sore and raw feeling.  She denies any chest pain, near-syncope, palpitations, orthopnea, PND, lower extremity edema.  No history of smoking, bronchitis or pneumonia.     Patient Active Problem List   Diagnosis Date Noted  . Depression with anxiety 07/17/2015  . Essential hypertension 05/10/2014     Prior to Admission medications   Medication Sig Start Date End Date Taking? Authorizing Provider  COLLAGEN PO Take by mouth.   Yes [provider]  FLUoxetine (PROZAC) 10 MG capsule Take 1 capsule (10 mg total) by mouth daily. 08/10/17  Yes Whittemore, Modena Nunnery, MD  hydrochlorothiazide (HYDRODIURIL) 50 MG tablet Take 1 tablet (50 mg total) by mouth daily. 08/12/18  Yes Delsa Grana, PA-C     Allergies  Allergen Reactions  . Oxycodone Nausea And Vomiting     Family History  Problem Relation Age of Onset  . Cancer Mother   . Hypertension Mother   . Alcohol abuse Father   . Cancer Father        Pancreatic  . Diabetes Father   . Hypertension Brother   . Depression Sister   . Mental illness Sister      Social History   Socioeconomic History  . Marital status: Married    Spouse name: Not on file  .  Number of children: Not on file  . Years of education: Not on file  . Highest education level: Not on file  Occupational History  . Not on file  Social Needs  . Financial resource strain: Not on file  . Food insecurity:    Worry: Not on file    Inability: Not on file  . Transportation needs:    Medical: Not on file    Non-medical: Not on file  Tobacco Use  . Smoking status: Never Smoker  . Smokeless tobacco: Never Used  Substance and Sexual Activity  . Alcohol use: Yes    Comment: occasionally  . Drug use: No  . Sexual activity: Yes  Lifestyle  . Physical activity:    Days per week: Not on file    Minutes per session: Not on file  . Stress: Not on file  Relationships  . Social connections:    Talks on phone: Not on file    Gets together: Not on file    Attends religious service: Not on file    Active member of club or organization: Not on file    Attends meetings of clubs or organizations: Not on file    Relationship status: Not on file  . Intimate partner violence:    Fear of current or ex partner: Not on  file    Emotionally abused: Not on file    Physically abused: Not on file    Forced sexual activity: Not on file  Other Topics Concern  . Not on file  Social History Narrative  . Not on file     Review of Systems  Constitutional: Negative.   HENT: Negative.   Eyes: Negative.   Respiratory: Negative.   Cardiovascular: Negative.   Gastrointestinal: Negative.   Endocrine: Negative.   Genitourinary: Negative.   Musculoskeletal: Negative.   Skin: Negative.   Allergic/Immunologic: Negative.   Neurological: Negative.   Hematological: Negative.   Psychiatric/Behavioral: Negative.   All other systems reviewed and are negative.      Objective:    Vitals:   10/07/18 1436  BP: 120/80  Pulse: 68  Resp: 17  Temp: 98.2 F (36.8 C)  TempSrc: Oral  SpO2: 98%  Weight: 178 lb 4 oz (80.9 kg)  Height: 5\' 1"  (1.549 m)      Physical Exam Vitals signs and  nursing note reviewed.  Constitutional:      General: She is not in acute distress.    Appearance: She is well-developed. She is not ill-appearing, toxic-appearing or diaphoretic.  HENT:     Head: Normocephalic and atraumatic.     Right Ear: Hearing, tympanic membrane, ear canal and external ear normal.     Left Ear: Hearing, tympanic membrane, ear canal and external ear normal.     Nose: Mucosal edema and rhinorrhea present.     Right Sinus: No maxillary sinus tenderness or frontal sinus tenderness.     Left Sinus: No maxillary sinus tenderness or frontal sinus tenderness.     Mouth/Throat:     Mouth: Mucous membranes are moist. Mucous membranes are not pale.     Pharynx: Uvula midline. No oropharyngeal exudate, posterior oropharyngeal erythema or uvula swelling.     Tonsils: No tonsillar abscesses.  Eyes:     General:        Right eye: No discharge.        Left eye: No discharge.     Conjunctiva/sclera: Conjunctivae normal.     Pupils: Pupils are equal, round, and reactive to light.  Neck:     Musculoskeletal: Normal range of motion and neck supple.     Trachea: No tracheal deviation.  Cardiovascular:     Rate and Rhythm: Normal rate and regular rhythm.     Pulses: Normal pulses.     Heart sounds: Normal heart sounds. No murmur. No friction rub. No gallop.   Pulmonary:     Effort: Pulmonary effort is normal. No respiratory distress.     Breath sounds: No stridor. Examination of the right-lower field reveals decreased breath sounds. Examination of the left-lower field reveals decreased breath sounds. Decreased breath sounds, wheezing and rhonchi present. No rales.  Abdominal:     General: Bowel sounds are normal. There is no distension.     Palpations: Abdomen is soft.     Tenderness: There is no abdominal tenderness.  Musculoskeletal: Normal range of motion.  Skin:    General: Skin is warm and dry.     Coloration: Skin is not pale.     Findings: No rash.  Neurological:      Mental Status: She is alert.     Motor: No abnormal muscle tone.     Coordination: Coordination normal.  Psychiatric:        Behavior: Behavior normal.  Assessment & Plan:      ICD-10-CM   1. Acute bronchitis, unspecified organism J20.9 ipratropium-albuterol (DUONEB) 0.5-2.5 (3) MG/3ML nebulizer solution 3 mL    predniSONE (DELTASONE) 20 MG tablet    albuterol (PROVENTIL HFA;VENTOLIN HFA) 108 (90 Base) MCG/ACT inhaler    benzonatate (TESSALON) 100 MG capsule    azithromycin (ZITHROMAX) 250 MG tablet    Pt with 4 days of URI sx, productive cough and night sweats, mild scratchy throat.  She appears mildly ill on exam, expiratory wheeze which cleared with breathing treatments in clinic, patient was able to take a deeper breath with improved mild bilaterally at the bases, scattered rhonchi persisted, will treat for acute bronchitis.  F/up if not improving   Delsa Grana, PA-C 10/07/18 2:52 PM

## 2018-10-11 ENCOUNTER — Telehealth: Payer: Self-pay | Admitting: Family Medicine

## 2018-10-11 ENCOUNTER — Encounter: Payer: Self-pay | Admitting: Family Medicine

## 2018-10-11 MED ORDER — HYDROCOD POLST-CPM POLST ER 10-8 MG/5ML PO SUER: 5.0000 mL | Freq: Two times a day (BID) | ORAL | 0 refills | Status: DC | PRN

## 2018-10-11 NOTE — Telephone Encounter (Signed)
Patient called in stating that she is no better from Thursday. Patient states that she has been taking the tessalon perles, Z pak, and Prednisone but she is still experiencing a deep cough that is keeping her up at night and causing her to have chest discomfort. Patient would like to know if a cough syrup could be called in. She stated that she can take Tussinex even though she has an adverse effect to hydrocodone. Please advise?

## 2018-10-11 NOTE — Telephone Encounter (Signed)
Pt has oxycodone intolerance with N/V  Tussionex sent to pharmacy Complete all other medications

## 2018-10-11 NOTE — Telephone Encounter (Signed)
Spoke with patient and informed her that Tussinex was sent in. Patient verbalized understanding.

## 2018-12-22 DIAGNOSIS — M5126 Other intervertebral disc displacement, lumbar region: Secondary | ICD-10-CM | POA: Diagnosis not present

## 2018-12-22 DIAGNOSIS — M545 Low back pain: Secondary | ICD-10-CM | POA: Diagnosis not present

## 2018-12-22 DIAGNOSIS — M5416 Radiculopathy, lumbar region: Secondary | ICD-10-CM | POA: Diagnosis not present

## 2019-01-06 DIAGNOSIS — M545 Low back pain: Secondary | ICD-10-CM | POA: Diagnosis not present

## 2019-01-06 DIAGNOSIS — M5416 Radiculopathy, lumbar region: Secondary | ICD-10-CM | POA: Diagnosis not present

## 2019-01-06 DIAGNOSIS — M5126 Other intervertebral disc displacement, lumbar region: Secondary | ICD-10-CM | POA: Diagnosis not present

## 2019-01-10 DIAGNOSIS — M5416 Radiculopathy, lumbar region: Secondary | ICD-10-CM | POA: Diagnosis not present

## 2019-01-10 DIAGNOSIS — M545 Low back pain: Secondary | ICD-10-CM | POA: Diagnosis not present

## 2019-01-10 DIAGNOSIS — M5126 Other intervertebral disc displacement, lumbar region: Secondary | ICD-10-CM | POA: Diagnosis not present

## 2019-03-04 DIAGNOSIS — M5116 Intervertebral disc disorders with radiculopathy, lumbar region: Secondary | ICD-10-CM | POA: Diagnosis not present

## 2019-03-04 DIAGNOSIS — M5126 Other intervertebral disc displacement, lumbar region: Secondary | ICD-10-CM | POA: Diagnosis not present

## 2019-03-04 DIAGNOSIS — M4726 Other spondylosis with radiculopathy, lumbar region: Secondary | ICD-10-CM | POA: Diagnosis not present

## 2019-03-15 DIAGNOSIS — Z1231 Encounter for screening mammogram for malignant neoplasm of breast: Secondary | ICD-10-CM | POA: Diagnosis not present

## 2019-03-16 ENCOUNTER — Other Ambulatory Visit: Payer: Self-pay | Admitting: Obstetrics and Gynecology

## 2019-03-16 DIAGNOSIS — R928 Other abnormal and inconclusive findings on diagnostic imaging of breast: Secondary | ICD-10-CM

## 2019-03-22 ENCOUNTER — Other Ambulatory Visit: Payer: Self-pay | Admitting: Obstetrics and Gynecology

## 2019-03-22 ENCOUNTER — Ambulatory Visit
Admission: RE | Admit: 2019-03-22 | Discharge: 2019-03-22 | Disposition: A | Discharge status: Home / Self Care | Payer: Medicare Other | Source: Ambulatory Visit | Attending: Obstetrics and Gynecology | Admitting: Obstetrics and Gynecology

## 2019-03-22 ENCOUNTER — Other Ambulatory Visit: Payer: Self-pay

## 2019-03-22 DIAGNOSIS — R928 Other abnormal and inconclusive findings on diagnostic imaging of breast: Secondary | ICD-10-CM

## 2019-03-22 DIAGNOSIS — N6322 Unspecified lump in the left breast, upper inner quadrant: Secondary | ICD-10-CM | POA: Diagnosis not present

## 2019-03-22 DIAGNOSIS — N6324 Unspecified lump in the left breast, lower inner quadrant: Secondary | ICD-10-CM | POA: Diagnosis not present

## 2019-03-22 DIAGNOSIS — N6320 Unspecified lump in the left breast, unspecified quadrant: Secondary | ICD-10-CM

## 2019-03-24 ENCOUNTER — Other Ambulatory Visit: Payer: Self-pay | Admitting: Obstetrics and Gynecology

## 2019-03-24 ENCOUNTER — Other Ambulatory Visit: Payer: Self-pay

## 2019-03-24 ENCOUNTER — Ambulatory Visit
Admission: RE | Admit: 2019-03-24 | Discharge: 2019-03-24 | Disposition: A | Discharge status: Home / Self Care | Payer: Medicare Other | Source: Ambulatory Visit | Attending: Obstetrics and Gynecology | Admitting: Obstetrics and Gynecology

## 2019-03-24 DIAGNOSIS — N6320 Unspecified lump in the left breast, unspecified quadrant: Secondary | ICD-10-CM

## 2019-03-24 DIAGNOSIS — N641 Fat necrosis of breast: Secondary | ICD-10-CM | POA: Diagnosis not present

## 2019-03-24 DIAGNOSIS — N6315 Unspecified lump in the right breast, overlapping quadrants: Secondary | ICD-10-CM | POA: Diagnosis not present

## 2019-04-05 DIAGNOSIS — Z20828 Contact with and (suspected) exposure to other viral communicable diseases: Secondary | ICD-10-CM | POA: Diagnosis not present

## 2019-05-16 DIAGNOSIS — B0052 Herpesviral keratitis: Secondary | ICD-10-CM | POA: Diagnosis not present

## 2019-05-18 DIAGNOSIS — B0052 Herpesviral keratitis: Secondary | ICD-10-CM | POA: Diagnosis not present

## 2019-05-23 DIAGNOSIS — B0052 Herpesviral keratitis: Secondary | ICD-10-CM | POA: Diagnosis not present

## 2019-06-20 DIAGNOSIS — H0102B Squamous blepharitis left eye, upper and lower eyelids: Secondary | ICD-10-CM | POA: Diagnosis not present

## 2019-06-20 DIAGNOSIS — H0102A Squamous blepharitis right eye, upper and lower eyelids: Secondary | ICD-10-CM | POA: Diagnosis not present

## 2019-06-20 DIAGNOSIS — B0052 Herpesviral keratitis: Secondary | ICD-10-CM | POA: Diagnosis not present

## 2019-06-20 DIAGNOSIS — H43393 Other vitreous opacities, bilateral: Secondary | ICD-10-CM | POA: Diagnosis not present

## 2019-06-20 DIAGNOSIS — H2513 Age-related nuclear cataract, bilateral: Secondary | ICD-10-CM | POA: Diagnosis not present

## 2019-08-11 DIAGNOSIS — Z1159 Encounter for screening for other viral diseases: Secondary | ICD-10-CM | POA: Diagnosis not present

## 2019-08-16 ENCOUNTER — Encounter: Payer: Medicare Other | Admitting: Family Medicine

## 2019-08-16 DIAGNOSIS — K635 Polyp of colon: Secondary | ICD-10-CM | POA: Diagnosis not present

## 2019-08-16 DIAGNOSIS — D122 Benign neoplasm of ascending colon: Secondary | ICD-10-CM | POA: Diagnosis not present

## 2019-08-16 DIAGNOSIS — Z8371 Family history of colonic polyps: Secondary | ICD-10-CM | POA: Diagnosis not present

## 2019-08-16 LAB — HM COLONOSCOPY

## 2019-08-19 DIAGNOSIS — D122 Benign neoplasm of ascending colon: Secondary | ICD-10-CM | POA: Diagnosis not present

## 2019-08-19 DIAGNOSIS — K635 Polyp of colon: Secondary | ICD-10-CM | POA: Diagnosis not present

## 2019-08-26 ENCOUNTER — Other Ambulatory Visit: Payer: Medicare Other

## 2019-08-26 DIAGNOSIS — Z13220 Encounter for screening for lipoid disorders: Secondary | ICD-10-CM | POA: Diagnosis not present

## 2019-08-26 DIAGNOSIS — Z0000 Encounter for general adult medical examination without abnormal findings: Secondary | ICD-10-CM | POA: Diagnosis not present

## 2019-08-26 DIAGNOSIS — I10 Essential (primary) hypertension: Secondary | ICD-10-CM | POA: Diagnosis not present

## 2019-08-27 LAB — CBC WITH DIFFERENTIAL/PLATELET
Absolute Monocytes: 756 cells/uL (ref 200–950)
Basophils Absolute: 63 cells/uL (ref 0–200)
Basophils Relative: 1 %
Eosinophils Absolute: 233 cells/uL (ref 15–500)
Eosinophils Relative: 3.7 %
HCT: 43.6 % (ref 35.0–45.0)
Hemoglobin: 14.5 g/dL (ref 11.7–15.5)
Lymphs Abs: 2174 cells/uL (ref 850–3900)
MCH: 29.9 pg (ref 27.0–33.0)
MCHC: 33.3 g/dL (ref 32.0–36.0)
MCV: 89.9 fL (ref 80.0–100.0)
MPV: 10.5 fL (ref 7.5–12.5)
Monocytes Relative: 12 %
Neutro Abs: 3074 cells/uL (ref 1500–7800)
Neutrophils Relative %: 48.8 %
Platelets: 261 10*3/uL (ref 140–400)
RBC: 4.85 10*6/uL (ref 3.80–5.10)
RDW: 12.3 % (ref 11.0–15.0)
Total Lymphocyte: 34.5 %
WBC: 6.3 10*3/uL (ref 3.8–10.8)

## 2019-08-27 LAB — COMPREHENSIVE METABOLIC PANEL
AG Ratio: 1.6 (calc) (ref 1.0–2.5)
ALT: 14 U/L (ref 6–29)
AST: 18 U/L (ref 10–35)
Albumin: 4.3 g/dL (ref 3.6–5.1)
Alkaline phosphatase (APISO): 73 U/L (ref 37–153)
BUN: 12 mg/dL (ref 7–25)
CO2: 29 mmol/L (ref 20–32)
Calcium: 9.8 mg/dL (ref 8.6–10.4)
Chloride: 100 mmol/L (ref 98–110)
Creat: 0.88 mg/dL (ref 0.50–0.99)
Globulin: 2.7 g/dL (calc) (ref 1.9–3.7)
Glucose, Bld: 85 mg/dL (ref 65–99)
Potassium: 4.2 mmol/L (ref 3.5–5.3)
Sodium: 138 mmol/L (ref 135–146)
Total Bilirubin: 0.7 mg/dL (ref 0.2–1.2)
Total Protein: 7 g/dL (ref 6.1–8.1)

## 2019-08-27 LAB — LIPID PANEL
Cholesterol: 201 mg/dL — ABNORMAL HIGH (ref <200)
HDL: 54 mg/dL (ref >=50)
LDL Cholesterol (Calc): 123 mg/dL (calc) — ABNORMAL HIGH
Non-HDL Cholesterol (Calc): 147 mg/dL (calc) — ABNORMAL HIGH (ref <130)
Total CHOL/HDL Ratio: 3.7 (calc) (ref <5.0)
Triglycerides: 125 mg/dL (ref <150)

## 2019-08-31 ENCOUNTER — Other Ambulatory Visit: Payer: Self-pay

## 2019-08-31 ENCOUNTER — Ambulatory Visit (INDEPENDENT_AMBULATORY_CARE_PROVIDER_SITE_OTHER): Payer: Medicare Other | Admitting: Family Medicine

## 2019-08-31 ENCOUNTER — Encounter: Payer: Self-pay | Admitting: Family Medicine

## 2019-08-31 VITALS — BP 130/82 | HR 78 | Temp 98.4°F | Resp 16 | Ht 61.0 in | Wt 174.0 lb

## 2019-08-31 DIAGNOSIS — F418 Other specified anxiety disorders: Secondary | ICD-10-CM | POA: Diagnosis not present

## 2019-08-31 DIAGNOSIS — N951 Menopausal and female climacteric states: Secondary | ICD-10-CM

## 2019-08-31 DIAGNOSIS — E785 Hyperlipidemia, unspecified: Secondary | ICD-10-CM

## 2019-08-31 DIAGNOSIS — Z0001 Encounter for general adult medical examination with abnormal findings: Secondary | ICD-10-CM | POA: Diagnosis not present

## 2019-08-31 DIAGNOSIS — I10 Essential (primary) hypertension: Secondary | ICD-10-CM | POA: Diagnosis not present

## 2019-08-31 DIAGNOSIS — Z0000 Encounter for general adult medical examination without abnormal findings: Secondary | ICD-10-CM

## 2019-08-31 MED ORDER — FLUOXETINE HCL 10 MG PO CAPS: 10.0000 mg | ORAL_CAPSULE | Freq: Every day | ORAL | 3 refills | Status: DC

## 2019-08-31 MED ORDER — HYDROCHLOROTHIAZIDE 50 MG PO TABS: 50.0000 mg | ORAL_TABLET | Freq: Every day | ORAL | 3 refills | Status: DC

## 2019-08-31 NOTE — Patient Instructions (Addendum)
Schedule your bone density  F/U 1 year for physical

## 2019-08-31 NOTE — Progress Notes (Signed)
Subjective:   Patient presents for Medicare Annual/Subsequent preventive examination.   Review Past Medical/Family/Social: Per EMR  HTN- taking HCTZ as prescribed no side effects with the medication  MDD/ANXIETY- prozac 10 mg at bedtime this helps keep her nerves that day and she also sleeps well with it.  At this time she would like to continue in the setting of the pandemic and everything else going on stress wise.  Had back surgery with Dr. Vertell Limber , did well overa , no PT was needed, not chronoc pain medications    Mammogram done in June had Breast biopsy this came back benign    Oct 27th had colonoscopy - Dr. Oletta Lamas- had 2 polyps   August seen by [facility] care had viral infection in the eye completed treatment, no change in vision. Wears glasses   Risk Factors  Current exercise habits: walks Dietary issues discussed: No major concerns  Cardiac risk factors: Obesity (BMI >= 30 kg/m2).  Mild hyperlipidemia, hypertension  Depression Screen  (Note: if answer to either of the following is "Yes", a more complete depression screening is indicated)  Over the past two weeks, have you felt down, depressed or hopeless? No Over the past two weeks, have you felt little interest or pleasure in doing things? No Have you lost interest or pleasure in daily life? No Do you often feel hopeless? No Do you cry easily over simple problems? No   Activities of Daily Living  In your present state of health, do you have any difficulty performing the following activities?:  Driving? No  Managing money? No  Feeding yourself? No  Getting from bed to chair? No  Climbing a flight of stairs? No  Preparing food and eating?: No  Bathing or showering? No  Getting dressed: No  Getting to the toilet? No  Using the toilet:No  Moving around from place to place: No  In the past year have you fallen or had a near fall?:No  Are you sexually active? No  Do you have more than one partner? No   Hearing  Difficulties: No  Do you often ask people to speak up or repeat themselves? No  Do you experience ringing or noises in your ears? No Do you have difficulty understanding soft or whispered voices? No  Do you feel that you have a problem with memory? No Do you often misplace items? No  Do you feel safe at home? Yes  Cognitive Testing  Alert? Yes Normal Appearance?Yes  Oriented to person? Yes Place? Yes  Time? Yes  Recall of three objects? Yes  Can perform simple calculations? Yes  Displays appropriate judgment?Yes  Can read the correct time from a watch face?Yes   List the Names of Other Physician/Practitioners you currently use:   GYN- Dr. Philis Pique  Neurosurgery- Dr. Vertell Limber  GI- Dr. [facility]  Dentist   Screening Tests / Date Colonoscopy    Due in  2020                  Zostavax - UTD Mammogram - Due in January, gets done in GYN Office  Bone Density- Due  Influenza Vaccine  UTD Tetanus/tdap UTD Pneumonia-UTD  ROS: GEN- denies fatigue, fever, weight loss,weakness, recent illness HEENT- denies eye drainage, change in vision, nasal discharge, CVS- denies chest pain, palpitations RESP- denies SOB, cough, wheeze ABD- denies N/V, change in stools, abd pain GU- denies dysuria, hematuria, dribbling, incontinence MSK- denies joint pain, muscle aches, injury Neuro-  denies headache, dizziness, syncope, seizure activity  PHYSICAL: vitals reviewed  GEN- NAD, alert and oriented x3 HEENT- PERRL, EOMI, non injected sclera, pink conjunctiva, MMM, oropharynx clear, TM clear bilat no effusion Neck- Supple, no thryomegaly, no bruit  CVS- RRR, no murmur RESP-CTAB ABD-NABS,soft,ND,NT Psych-normal affect and mood  EXT- No edema Pulses- Radial, DP- 2+    Assessment:    Annual wellness medicare exam   Plan:    During the course of the visit the patient was educated and counseled about appropriate screening and preventive services including:   Bone Density- pt to  schedule   MDD- continue prozac at bedtime   Fasting labs reviewed  HTN- well controlled, no changes  Mild hyperlipidemia improved with dietary changes.  Continue with exercise.  We will obtain her records for her colonoscopy as well as her recent back surgery.  Discussed advanced directives handout given, full code  Discussed labs at bedside immunizations up-to-date   Fall/depression/audit C negative          Diet review for nutrition referral? Yes ____ Not Indicated __x__  Patient Instructions (the written plan) was given to the patient.  Medicare Attestation  I have personally reviewed:  The patient's medical and social history  Their use of alcohol, tobacco or illicit drugs  Their current medications and supplements  The patient's functional ability including ADLs,fall risks, home safety risks, cognitive, and hearing and visual impairment  Diet and physical activities  Evidence for depression or mood disorders  The patient's weight, height, BMI, and visual acuity have been recorded in the chart. I have made referrals, counseling, and provided education to the patient based on review of the above and I have provided the patient with a written personalized care plan for preventive services.

## 2019-09-05 ENCOUNTER — Other Ambulatory Visit: Payer: Self-pay | Admitting: Family Medicine

## 2019-09-05 DIAGNOSIS — E2839 Other primary ovarian failure: Secondary | ICD-10-CM

## 2019-09-13 ENCOUNTER — Encounter: Payer: Self-pay | Admitting: *Deleted

## 2019-09-13 ENCOUNTER — Other Ambulatory Visit: Payer: Self-pay

## 2019-09-13 ENCOUNTER — Ambulatory Visit
Admission: RE | Admit: 2019-09-13 | Discharge: 2019-09-13 | Disposition: A | Discharge status: Home / Self Care | Payer: Medicare Other | Source: Ambulatory Visit | Attending: Family Medicine | Admitting: Family Medicine

## 2019-09-13 ENCOUNTER — Encounter: Payer: Self-pay | Admitting: Family Medicine

## 2019-09-13 DIAGNOSIS — M8589 Other specified disorders of bone density and structure, multiple sites: Secondary | ICD-10-CM | POA: Diagnosis not present

## 2019-09-13 DIAGNOSIS — E2839 Other primary ovarian failure: Secondary | ICD-10-CM

## 2019-09-13 DIAGNOSIS — M8580 Other specified disorders of bone density and structure, unspecified site: Secondary | ICD-10-CM | POA: Insufficient documentation

## 2019-09-13 DIAGNOSIS — Z780 Asymptomatic menopausal state: Secondary | ICD-10-CM | POA: Diagnosis not present

## 2019-09-14 ENCOUNTER — Encounter: Payer: Self-pay | Admitting: *Deleted

## 2019-11-10 DIAGNOSIS — L578 Other skin changes due to chronic exposure to nonionizing radiation: Secondary | ICD-10-CM | POA: Diagnosis not present

## 2019-11-10 DIAGNOSIS — Z808 Family history of malignant neoplasm of other organs or systems: Secondary | ICD-10-CM | POA: Diagnosis not present

## 2019-11-10 DIAGNOSIS — D225 Melanocytic nevi of trunk: Secondary | ICD-10-CM | POA: Diagnosis not present

## 2019-11-10 DIAGNOSIS — L821 Other seborrheic keratosis: Secondary | ICD-10-CM | POA: Diagnosis not present

## 2019-11-10 DIAGNOSIS — Z23 Encounter for immunization: Secondary | ICD-10-CM | POA: Diagnosis not present

## 2019-11-10 DIAGNOSIS — L719 Rosacea, unspecified: Secondary | ICD-10-CM | POA: Diagnosis not present

## 2019-11-10 DIAGNOSIS — Z85828 Personal history of other malignant neoplasm of skin: Secondary | ICD-10-CM | POA: Diagnosis not present

## 2019-11-10 DIAGNOSIS — Z86018 Personal history of other benign neoplasm: Secondary | ICD-10-CM | POA: Diagnosis not present

## 2019-11-10 DIAGNOSIS — D2371 Other benign neoplasm of skin of right lower limb, including hip: Secondary | ICD-10-CM | POA: Diagnosis not present

## 2019-11-10 DIAGNOSIS — L281 Prurigo nodularis: Secondary | ICD-10-CM | POA: Diagnosis not present

## 2019-11-10 DIAGNOSIS — L739 Follicular disorder, unspecified: Secondary | ICD-10-CM | POA: Diagnosis not present

## 2019-12-05 ENCOUNTER — Encounter: Payer: Self-pay | Admitting: *Deleted

## 2020-03-02 IMAGING — US ULTRASOUND LEFT BREAST LIMITED
1 series · 11 of 11 positions shown · non-contrast
Comparison: March 15, 2019 and earlier priors

CLINICAL DATA: 67-year-old patient was recalled from recent
screening mammogram for evaluation of a possible medial left breast
mass. Patient denies any known recent trauma to the left breast.

EXAM:
DIGITAL DIAGNOSTIC LEFT MAMMOGRAM WITH TOMO
ULTRASOUND LEFT BREAST

[Series 1: ultrasound left breast limited · 0.07mm/px · 11 of 11 slices shown]
[im 1/11]
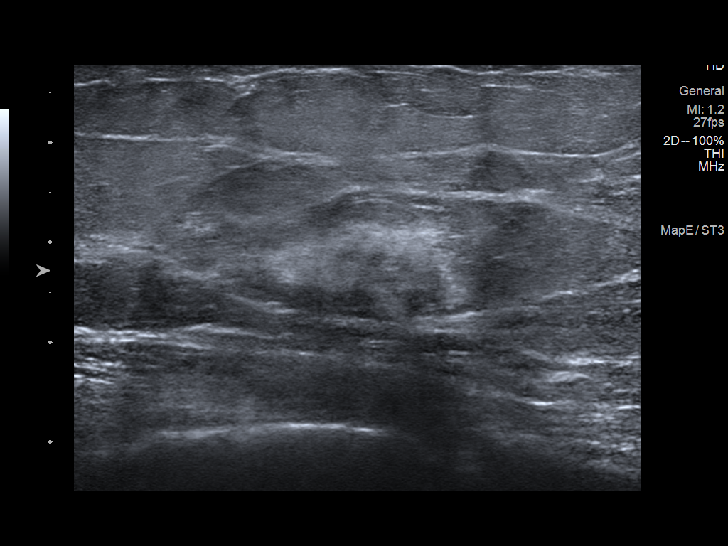
[im 2/11]
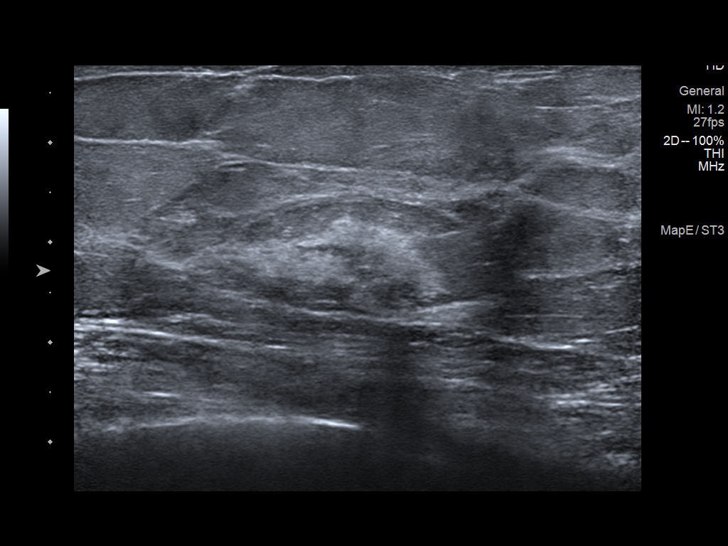
[im 3/11]
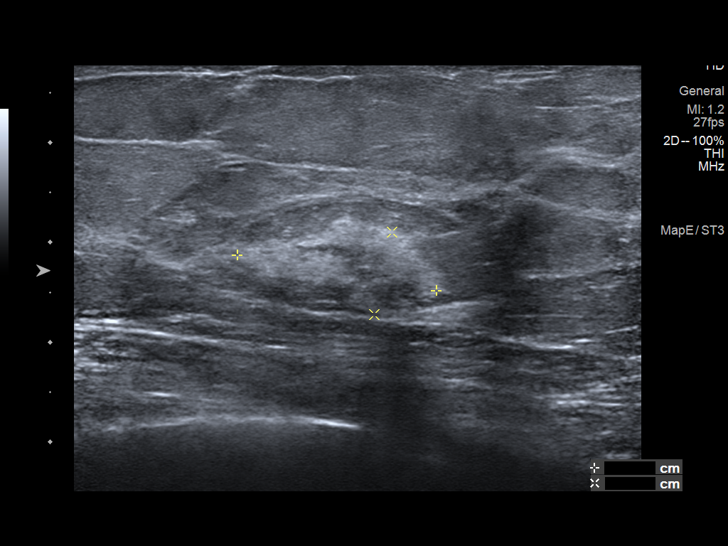
[im 4/11]
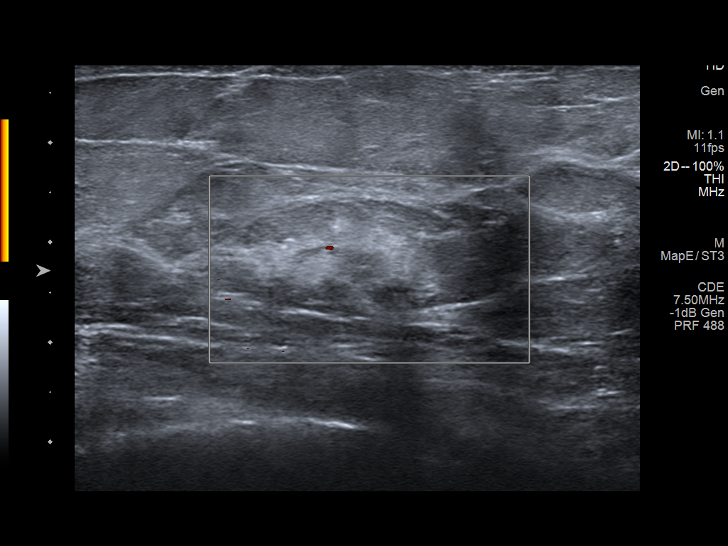
[im 5/11]
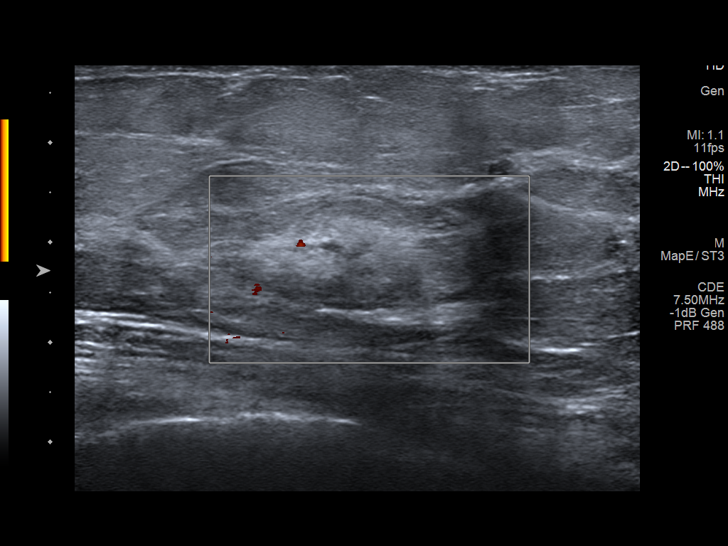
[im 6/11]
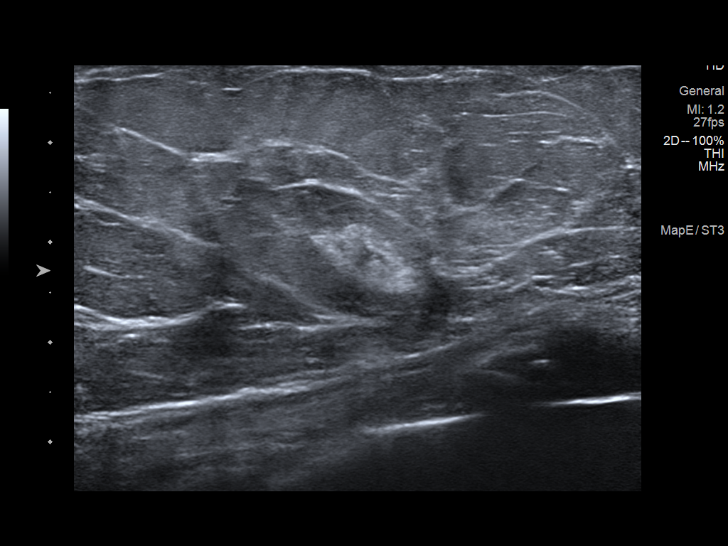
[im 7/11]
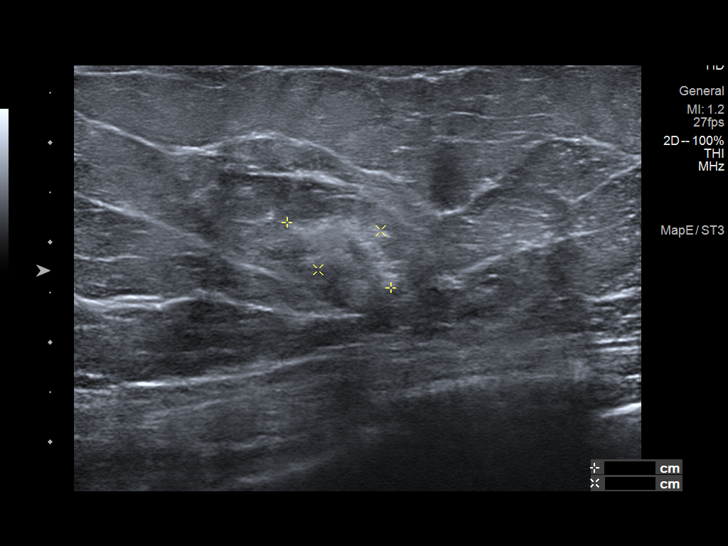
[im 8/11]
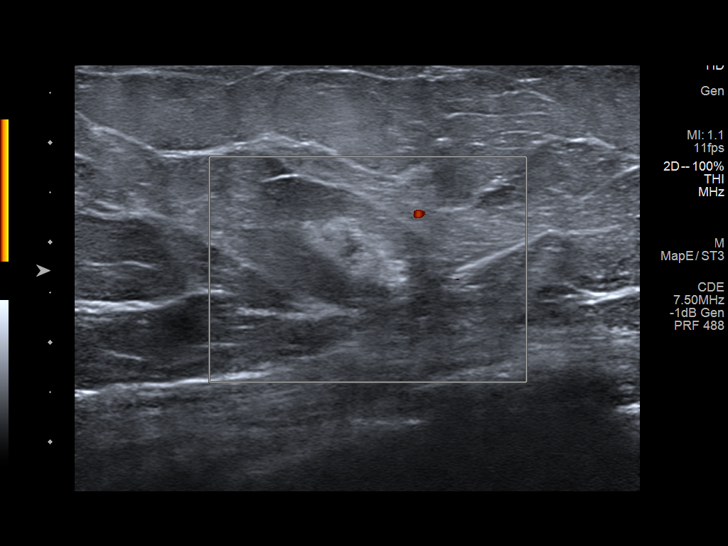
[im 9/11]
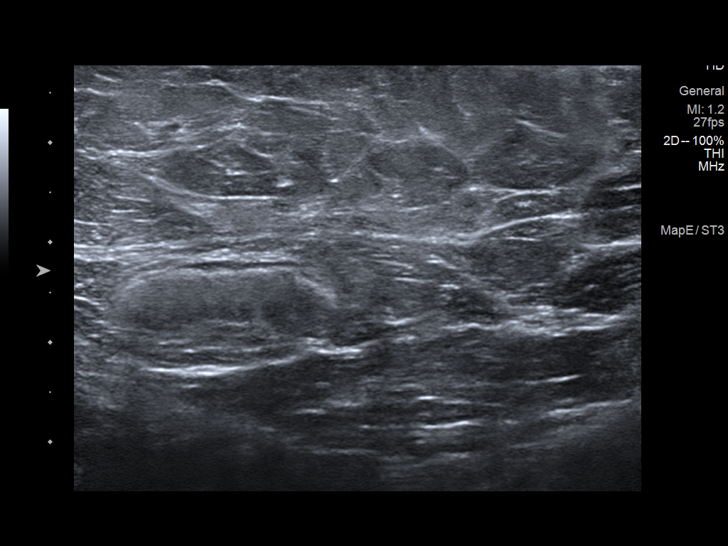
[im 10/11]
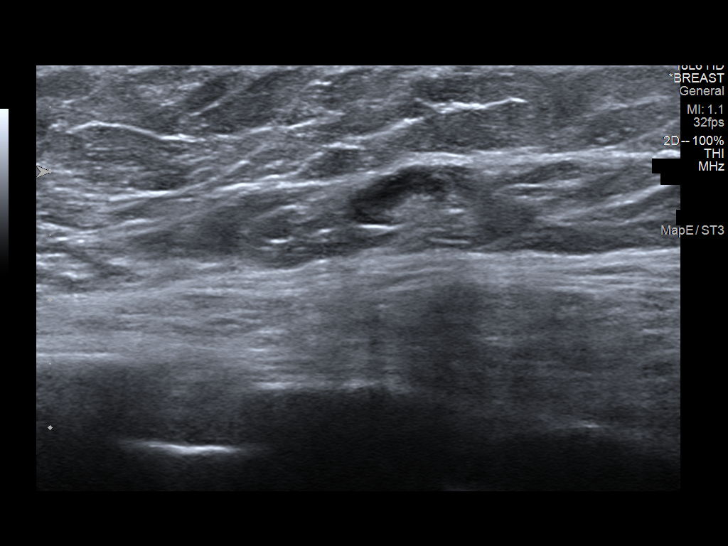
[im 11/11]
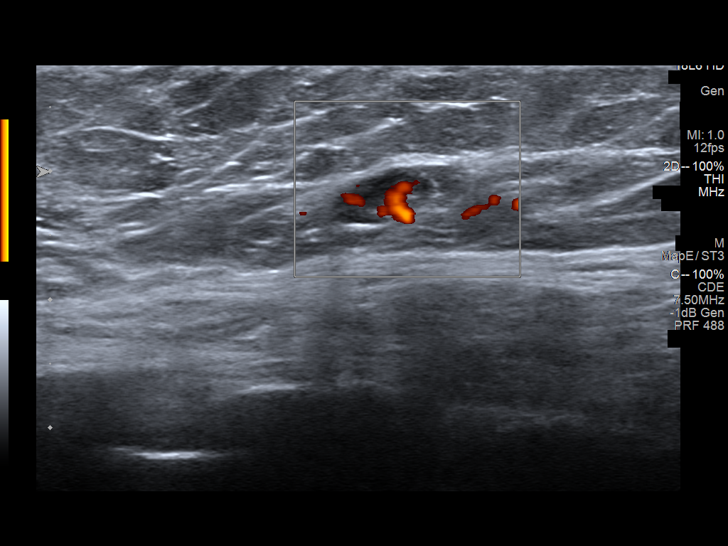

[11 of 11 positions shown; findings below may reference images not displayed]

ACR Breast Density Category b: There are scattered areas of
fibroglandular density.
FINDINGS: There is a persistent mixed density mass in the medial left breast
posteriorly. This mass measures approximately 2 cm and has some
interspersed fat.

Targeted ultrasound is performed, showing a mixed echogenicity
(predominantly hyperechoic) mass with indistinct margins in the 9
o'clock position of the left breast 3 cm from the nipple. This is
within the deep portion of the breast parenchyma and measures 2.0 x
0.8 x 1.2 cm. Small foci of vascular flow seen within the mass.
Ultrasound of the left axilla is negative for lymphadenopathy.
IMPRESSION: Mixed echogenicity mass within the 9 o'clock position of the left
breast. Malignancy cannot be completely excluded. Pseudoangiomatous
stromal hyperplasia and fat necrosis are possible benign
considerations.

RECOMMENDATION:
Ultrasound-guided core needle biopsy is recommended and will be
scheduled for the patient.

I have discussed the findings and recommendations with the patient.
Results were also provided in writing at the conclusion of the
visit. If applicable, a reminder letter will be sent to the patient
regarding the next appointment.

BI-RADS CATEGORY  4: Suspicious.

## 2020-03-02 IMAGING — MG DIGITAL DIAGNOSTIC UNILATERAL LEFT MAMMOGRAM WITH TOMO AND CAD
4 series · 4 of 12 positions shown · non-contrast
Comparison: March 15, 2019 and earlier priors

CLINICAL DATA: 67-year-old patient was recalled from recent
screening mammogram for evaluation of a possible medial left breast
mass. Patient denies any known recent trauma to the left breast.

EXAM:
DIGITAL DIAGNOSTIC LEFT MAMMOGRAM WITH TOMO
ULTRASOUND LEFT BREAST

[L CC synth-2D]
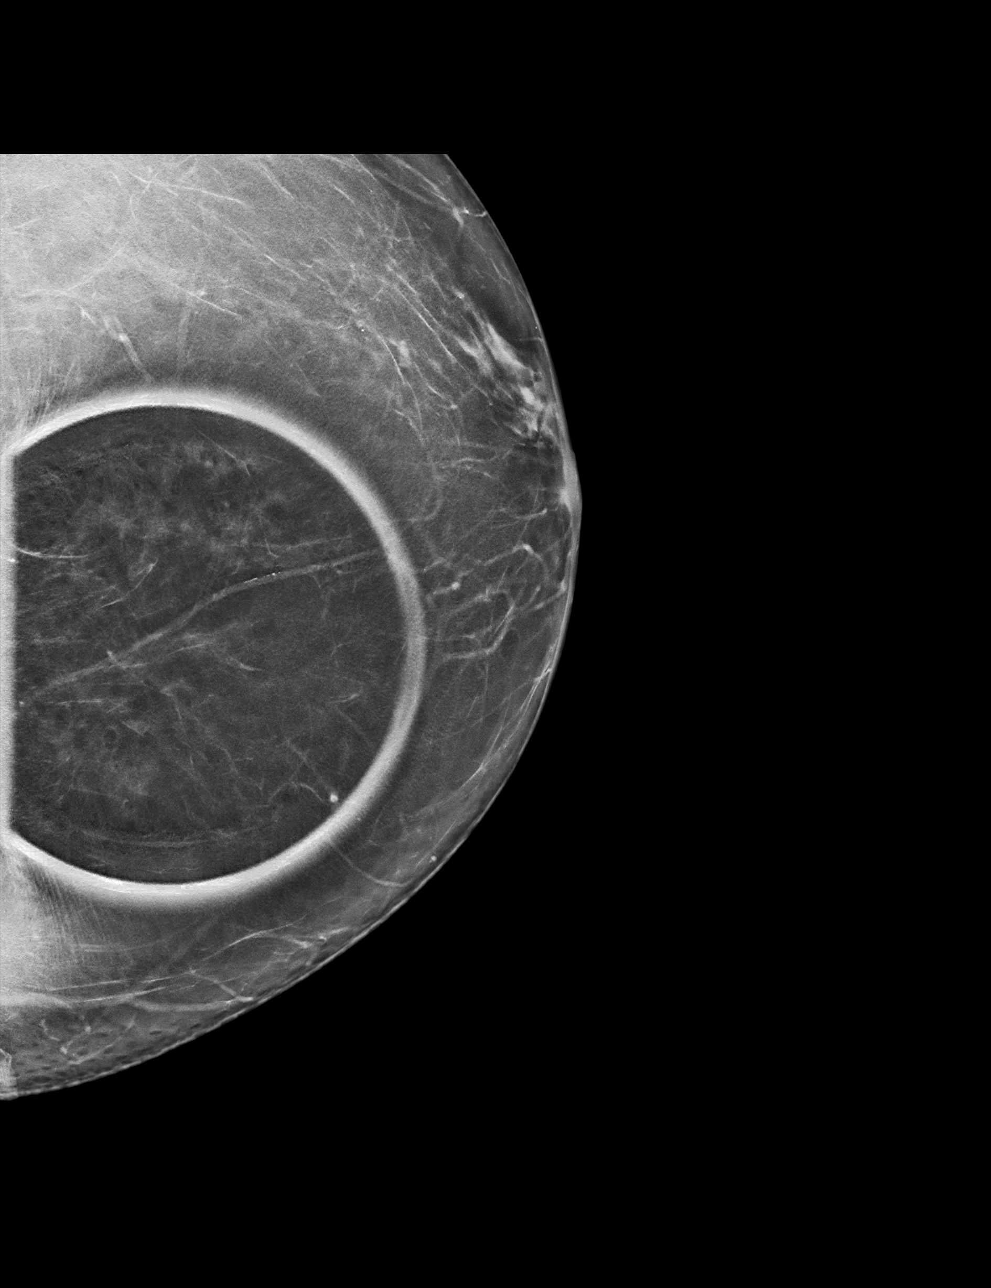

[L MLO synth-2D]
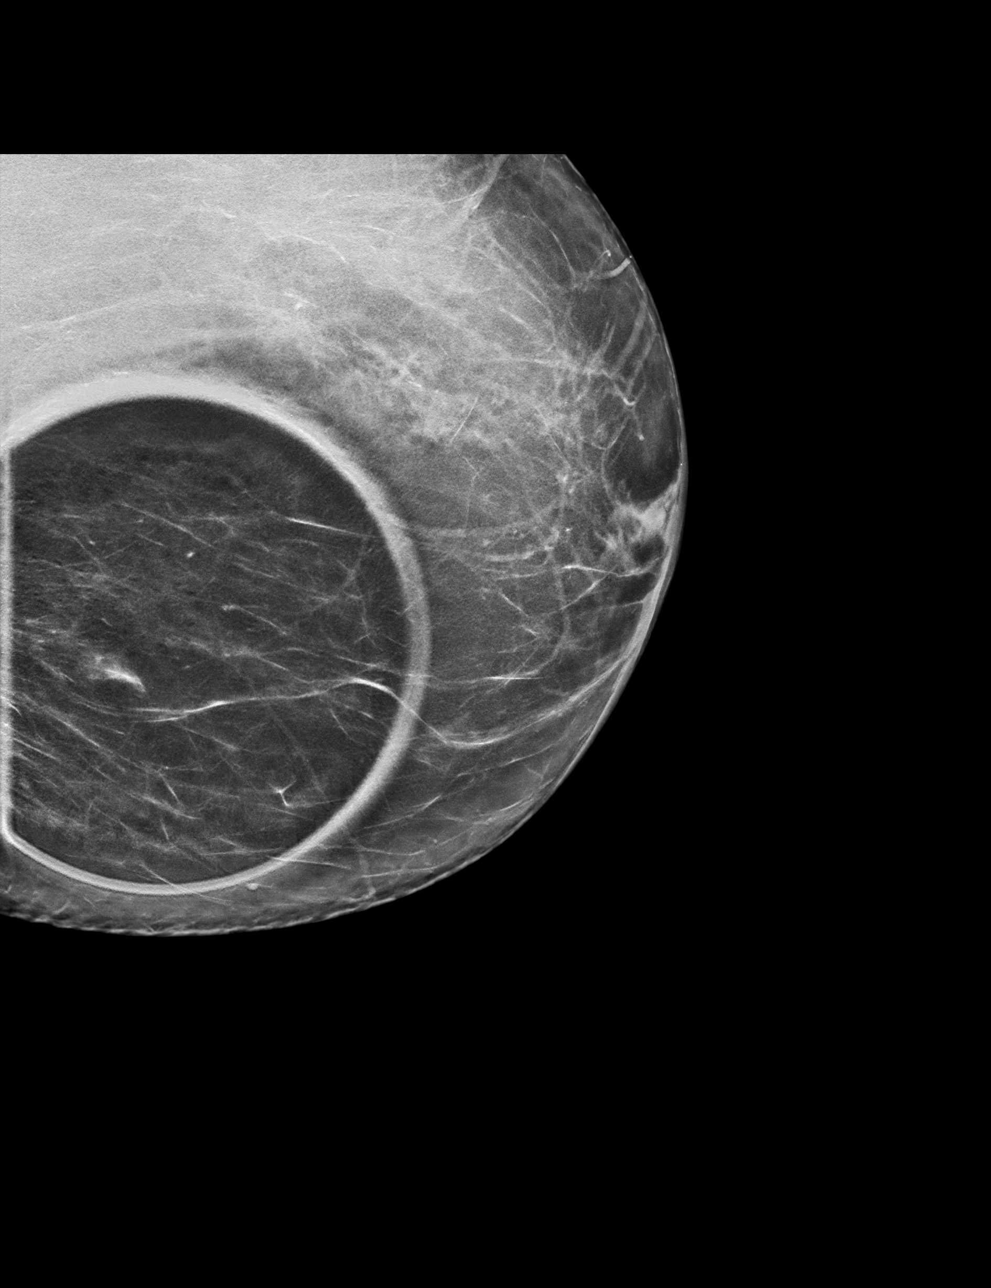

[L CC tomo · tomo slice 32/63.0]
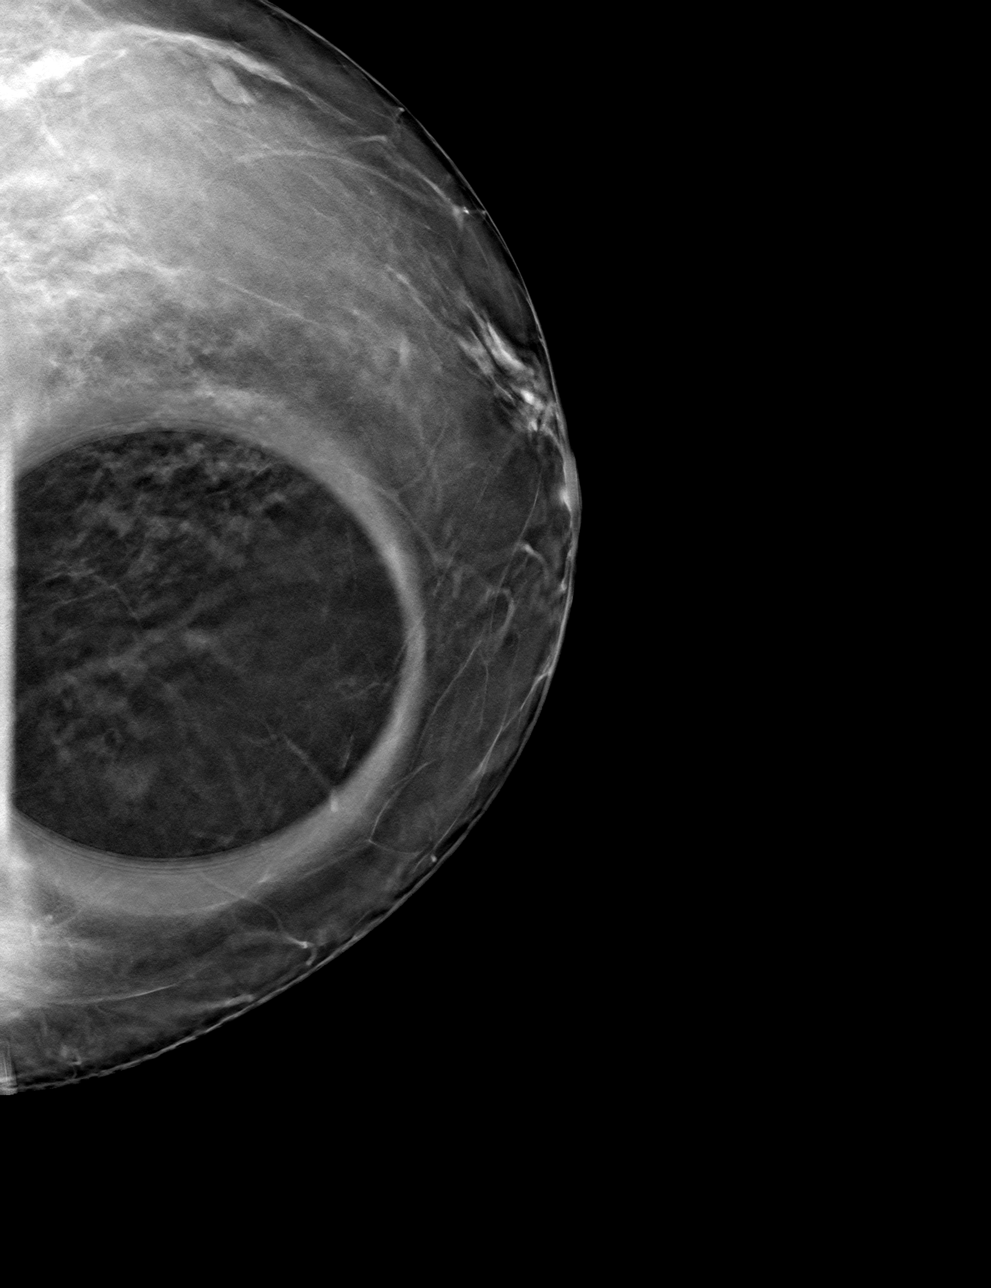

[L MLO tomo · tomo slice 37/72.0]
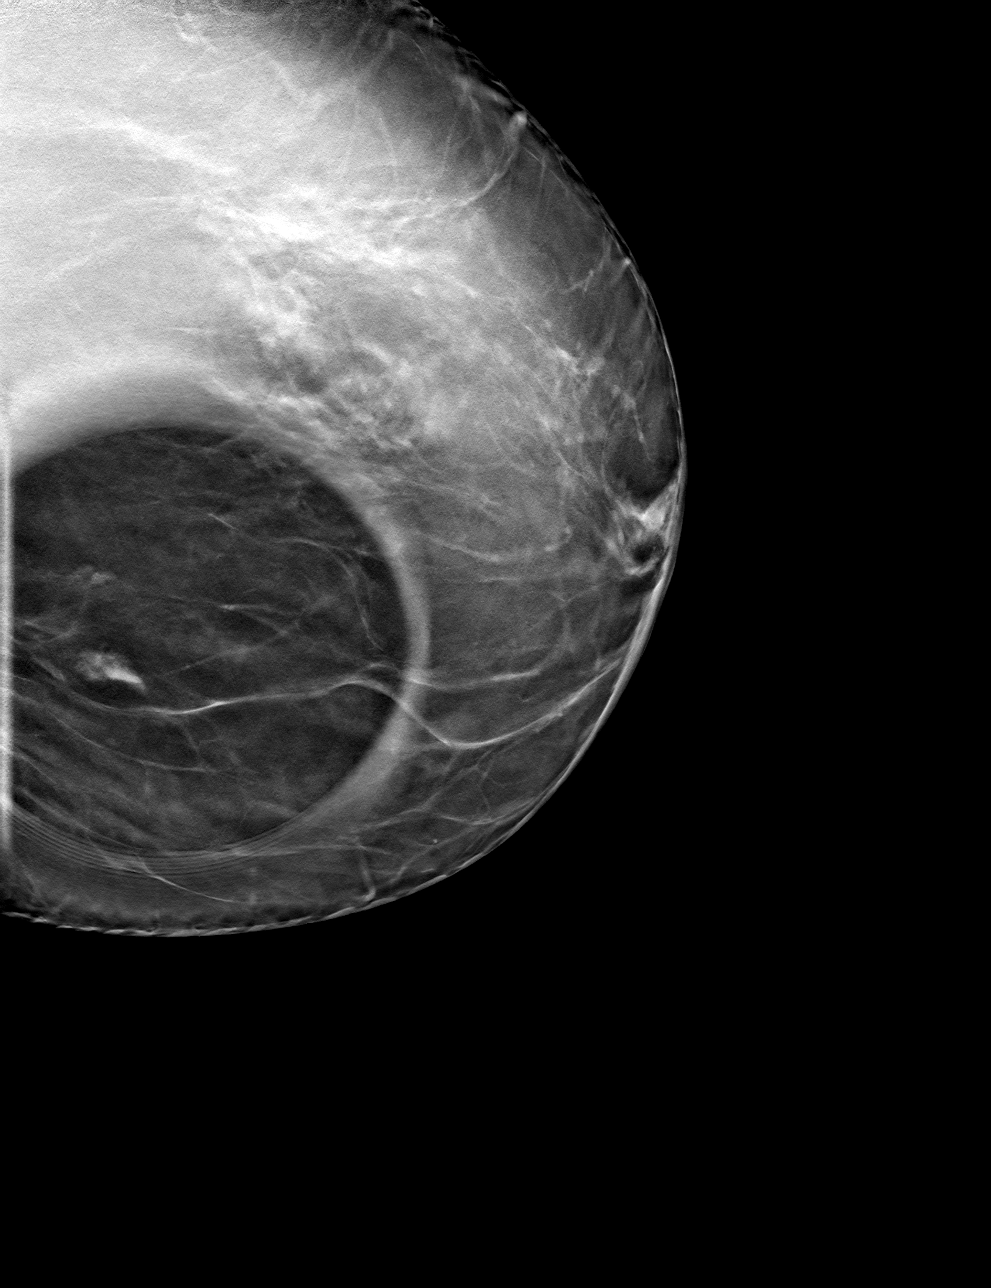

[4 of 12 positions shown; findings below may reference images not displayed]

ACR Breast Density Category b: There are scattered areas of
fibroglandular density.
FINDINGS: There is a persistent mixed density mass in the medial left breast
posteriorly. This mass measures approximately 2 cm and has some
interspersed fat.

Targeted ultrasound is performed, showing a mixed echogenicity
(predominantly hyperechoic) mass with indistinct margins in the 9
o'clock position of the left breast 3 cm from the nipple. This is
within the deep portion of the breast parenchyma and measures 2.0 x
0.8 x 1.2 cm. Small foci of vascular flow seen within the mass.
Ultrasound of the left axilla is negative for lymphadenopathy.
IMPRESSION: Mixed echogenicity mass within the 9 o'clock position of the left
breast. Malignancy cannot be completely excluded. Pseudoangiomatous
stromal hyperplasia and fat necrosis are possible benign
considerations.

RECOMMENDATION:
Ultrasound-guided core needle biopsy is recommended and will be
scheduled for the patient.

I have discussed the findings and recommendations with the patient.
Results were also provided in writing at the conclusion of the
visit. If applicable, a reminder letter will be sent to the patient
regarding the next appointment.

BI-RADS CATEGORY  4: Suspicious.

## 2020-03-04 IMAGING — US US BREAST BX W LOC DEV 1ST LESION IMG BX SPEC US GUIDE*L*
1 series · 8 of 8 positions shown · non-contrast
Comparison: Previous exam(s).
COMPARISON: Previous exam(s).

Addendum:
CLINICAL DATA: 67-year-old female with an indeterminate left breast
mass.

EXAM:
ULTRASOUND GUIDED LEFT BREAST CORE NEEDLE BIOPSY

[Series 1: us breast bx w loc dev 1st lesion img bx spec us g · 0.08mm/px · 8 of 8 slices shown]
[im 1/8]
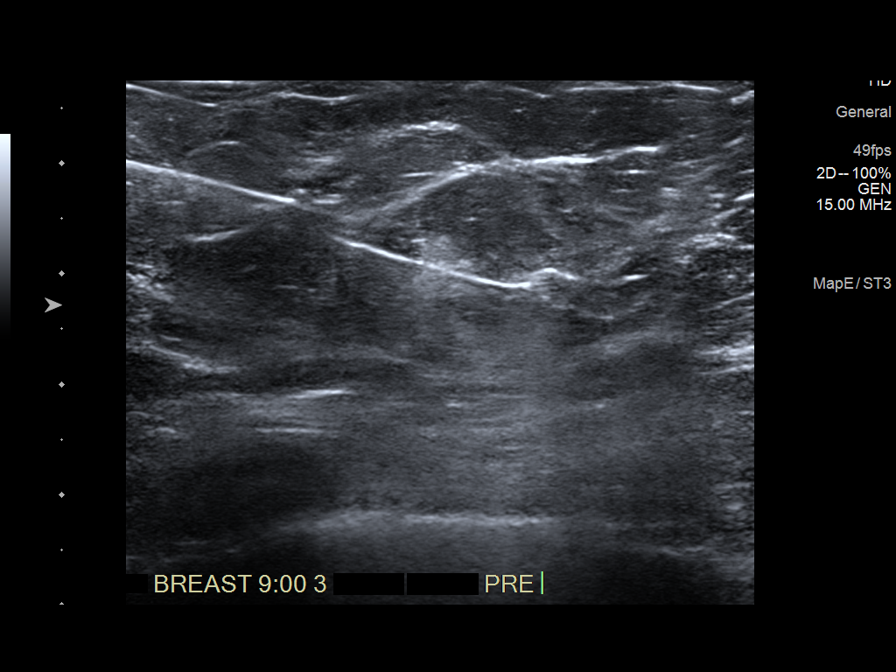
[im 2/8]
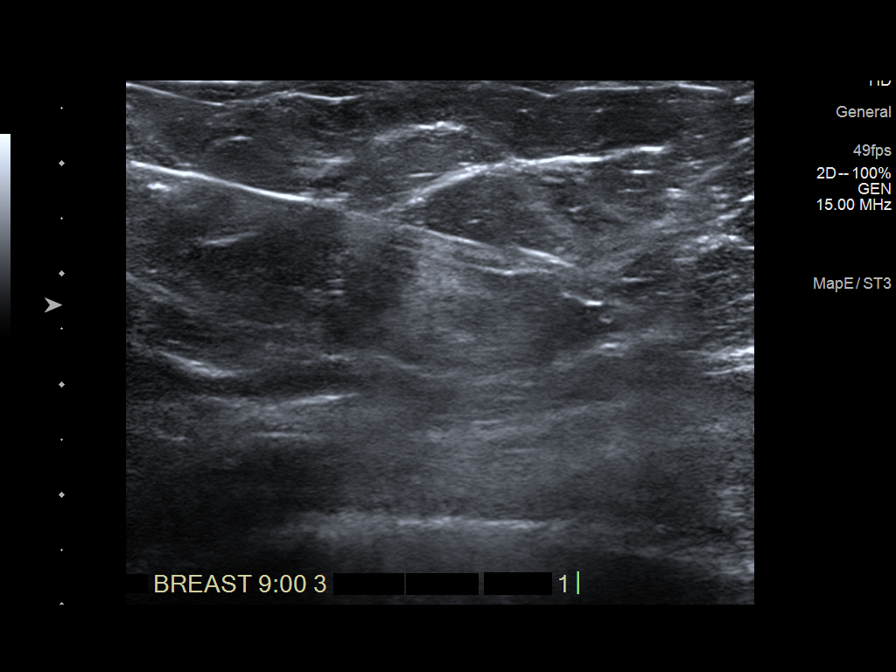
[im 3/8]
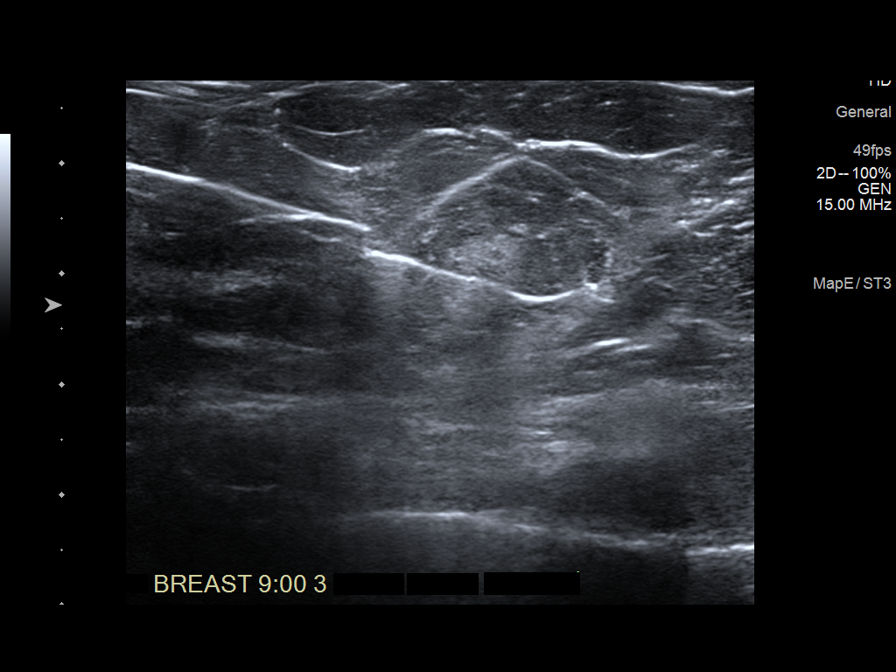
[im 4/8]
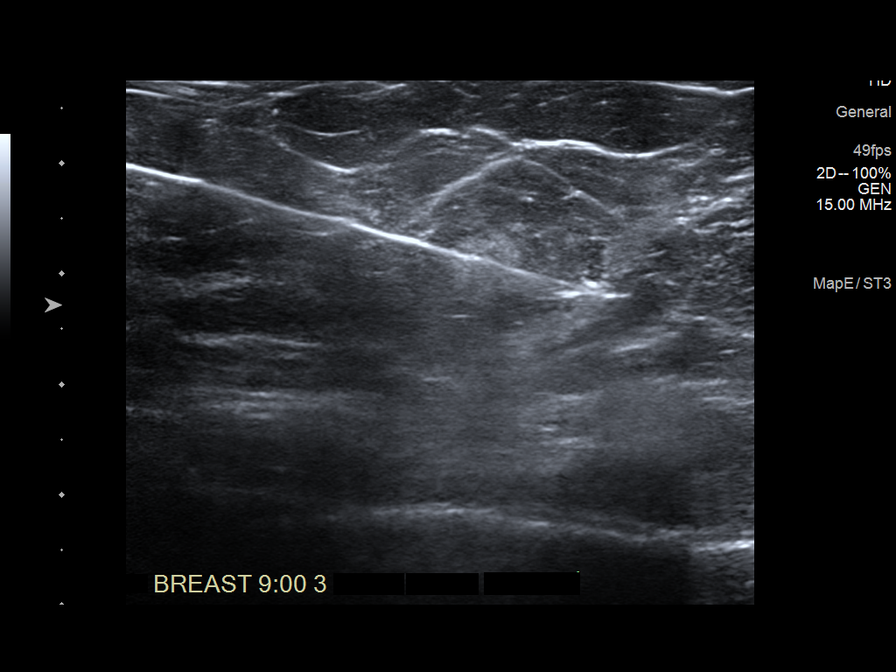
[im 5/8]
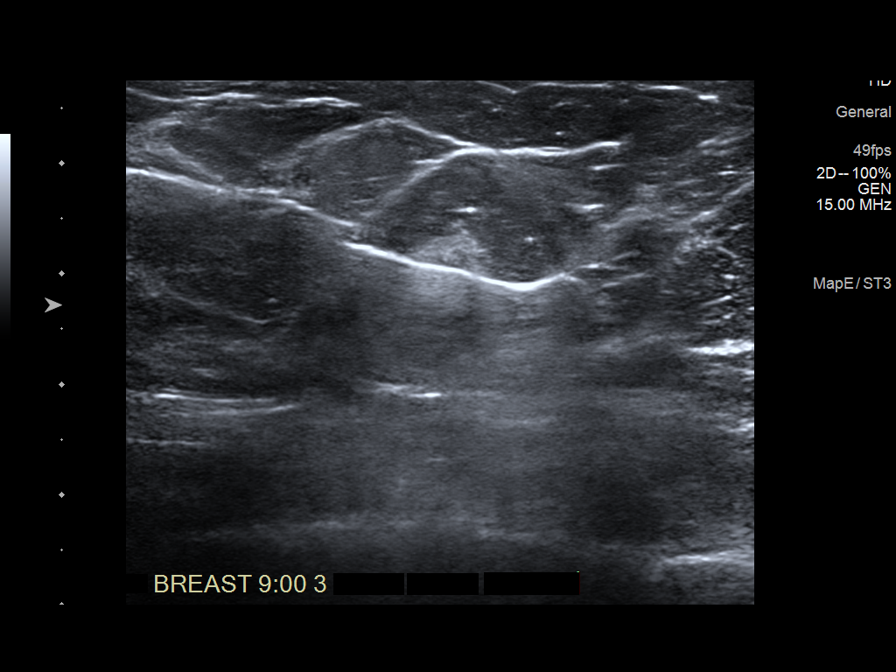
[im 6/8]
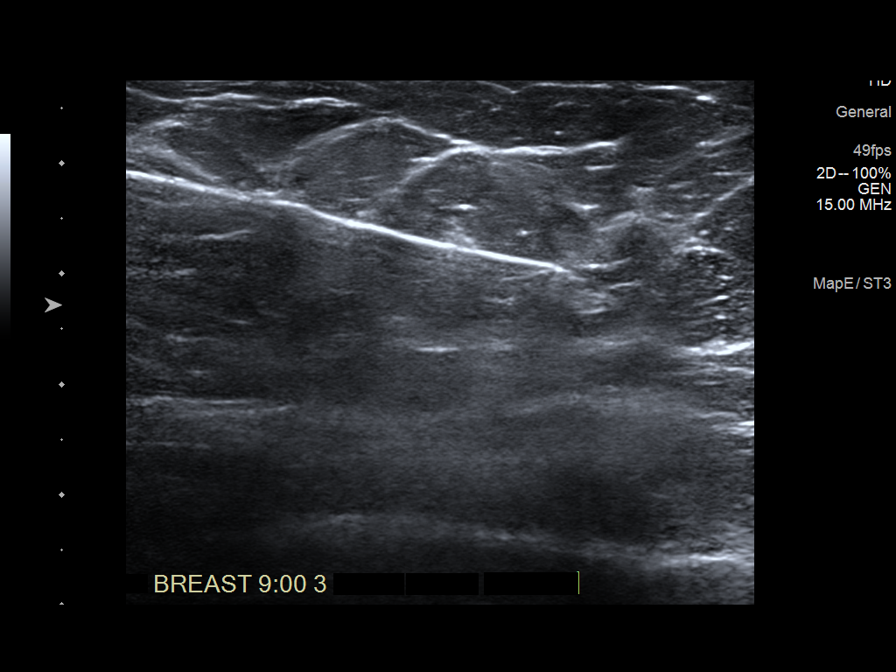
[im 7/8]
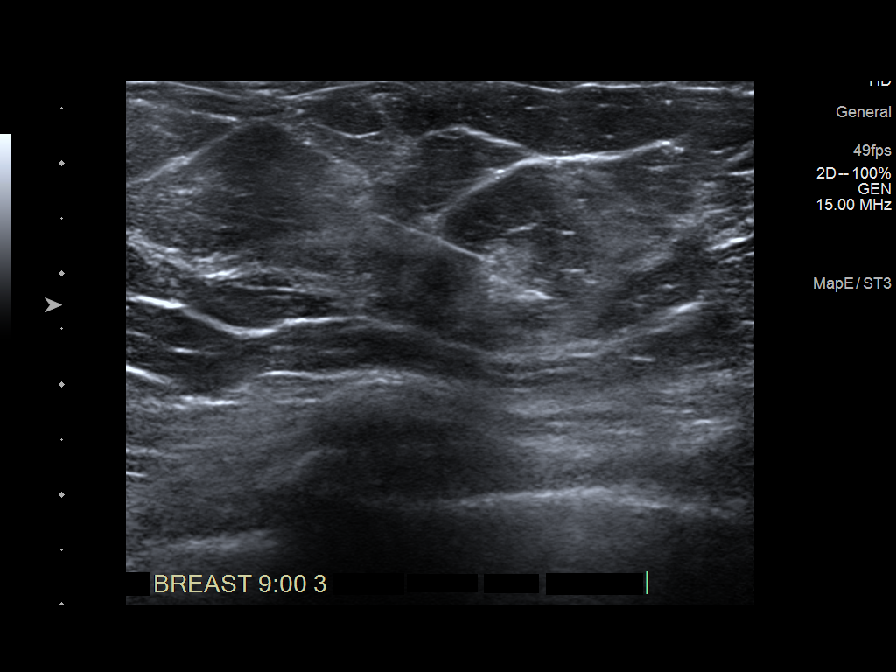
[im 8/8]
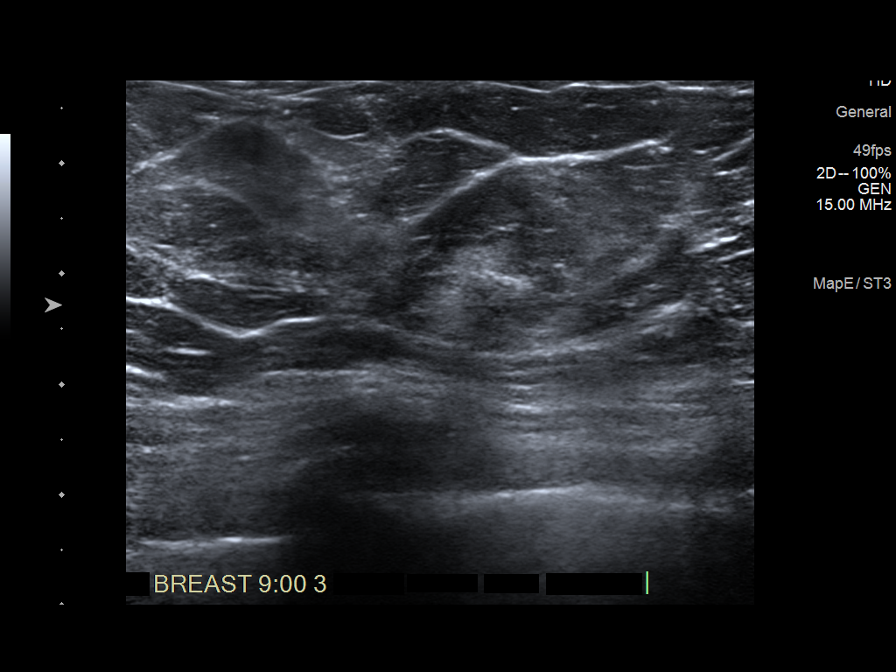

[8 of 8 positions shown; findings below may reference images not displayed]



Lesion quadrant: Upper inner quadrant

Using sterile technique and 1% Lidocaine as local anesthetic, under
direct ultrasound visualization, a 12 gauge Amzo device was
used to perform biopsy of a mass at the 9 o'clock position 3 cm from
the nipple using a inferior approach. At the conclusion of the
procedure a ribbon shaped tissue marker clip was deployed into the
biopsy cavity. Follow up 2 view mammogram was performed and dictated
separately.
IMPRESSION: Ultrasound guided biopsy of the left breast. No apparent
complications.

ADDENDUM:
Pathology revealed FAT NECROSIS AND FIBROSIS WITH CHRONIC
INFLAMMATION of the Left breast, 9 o'clock, 9cmfn. This was found to
be concordant by Dr. Lindita Poling.

Pathology results were discussed with the patient by telephone. The
patient reported doing well after the biopsy with tenderness at the
site. Post biopsy instructions and care were reviewed and questions
were answered. The patient was encouraged to call The [REDACTED]

The patient was instructed to return for annual screening

Pathology results reported by Kl Monzon, RN on 03/25/2019.



Lesion quadrant: Upper inner quadrant

Using sterile technique and 1% Lidocaine as local anesthetic, under
direct ultrasound visualization, a 12 gauge Amzo device was
used to perform biopsy of a mass at the 9 o'clock position 3 cm from
the nipple using a inferior approach. At the conclusion of the
procedure a ribbon shaped tissue marker clip was deployed into the
biopsy cavity. Follow up 2 view mammogram was performed and dictated
separately.
IMPRESSION: Ultrasound guided biopsy of the left breast. No apparent
complications.

## 2020-03-04 IMAGING — MG MM CLIP PLACEMENT
4 series · 4 of 12 positions shown · non-contrast
Comparison: Previous exam(s).

CLINICAL DATA: 67-year-old female status post ultrasound-guided
biopsy of the left breast.

EXAM:
DIAGNOSTIC LEFT MAMMOGRAM POST ULTRASOUND BIOPSY

[L ML synth-2D]
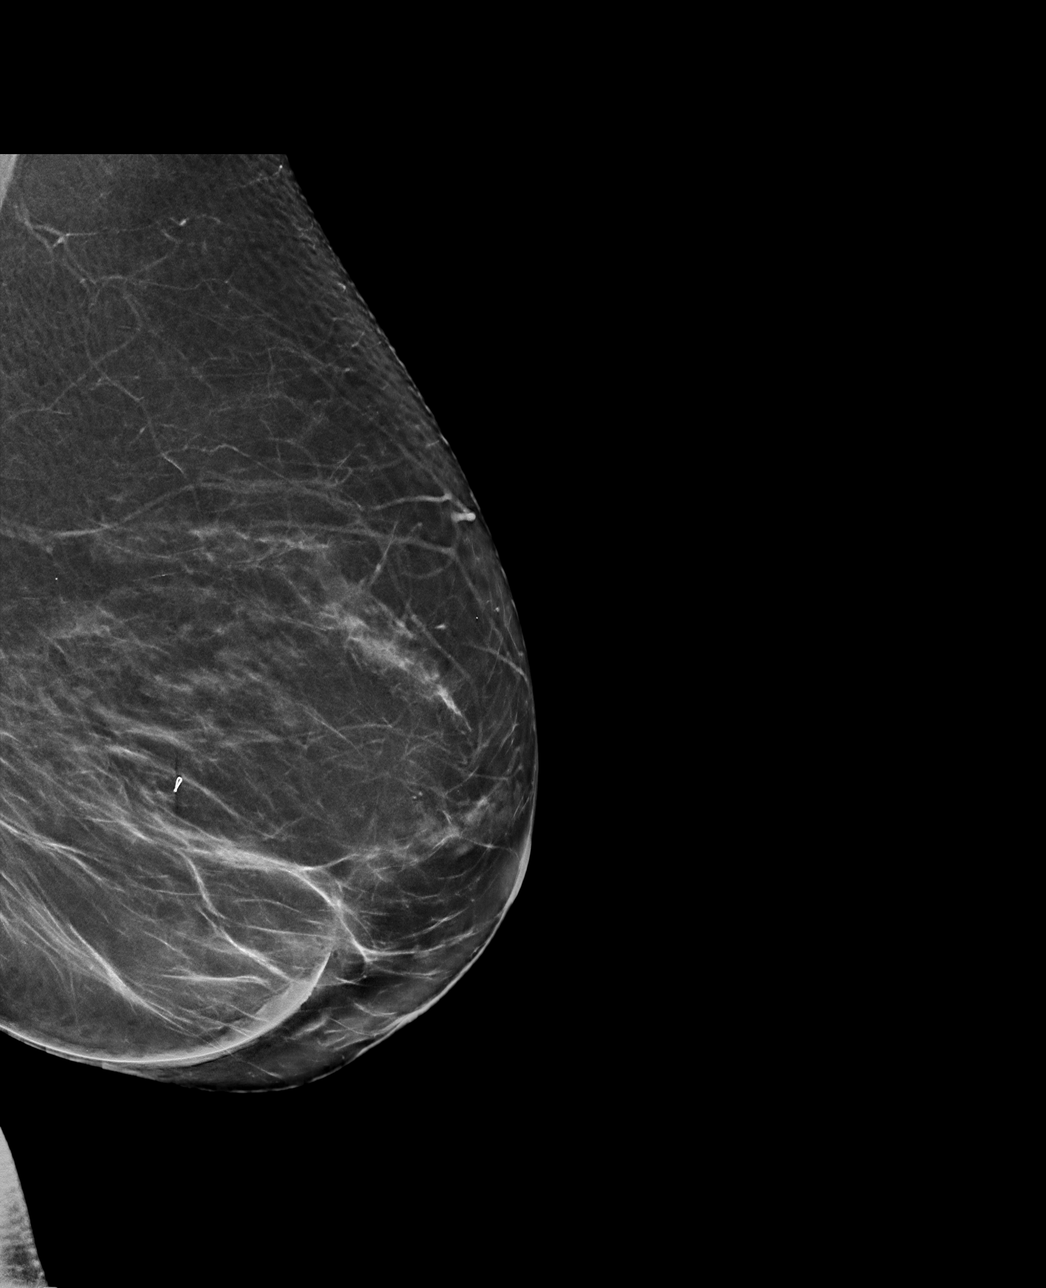

[L CC synth-2D]
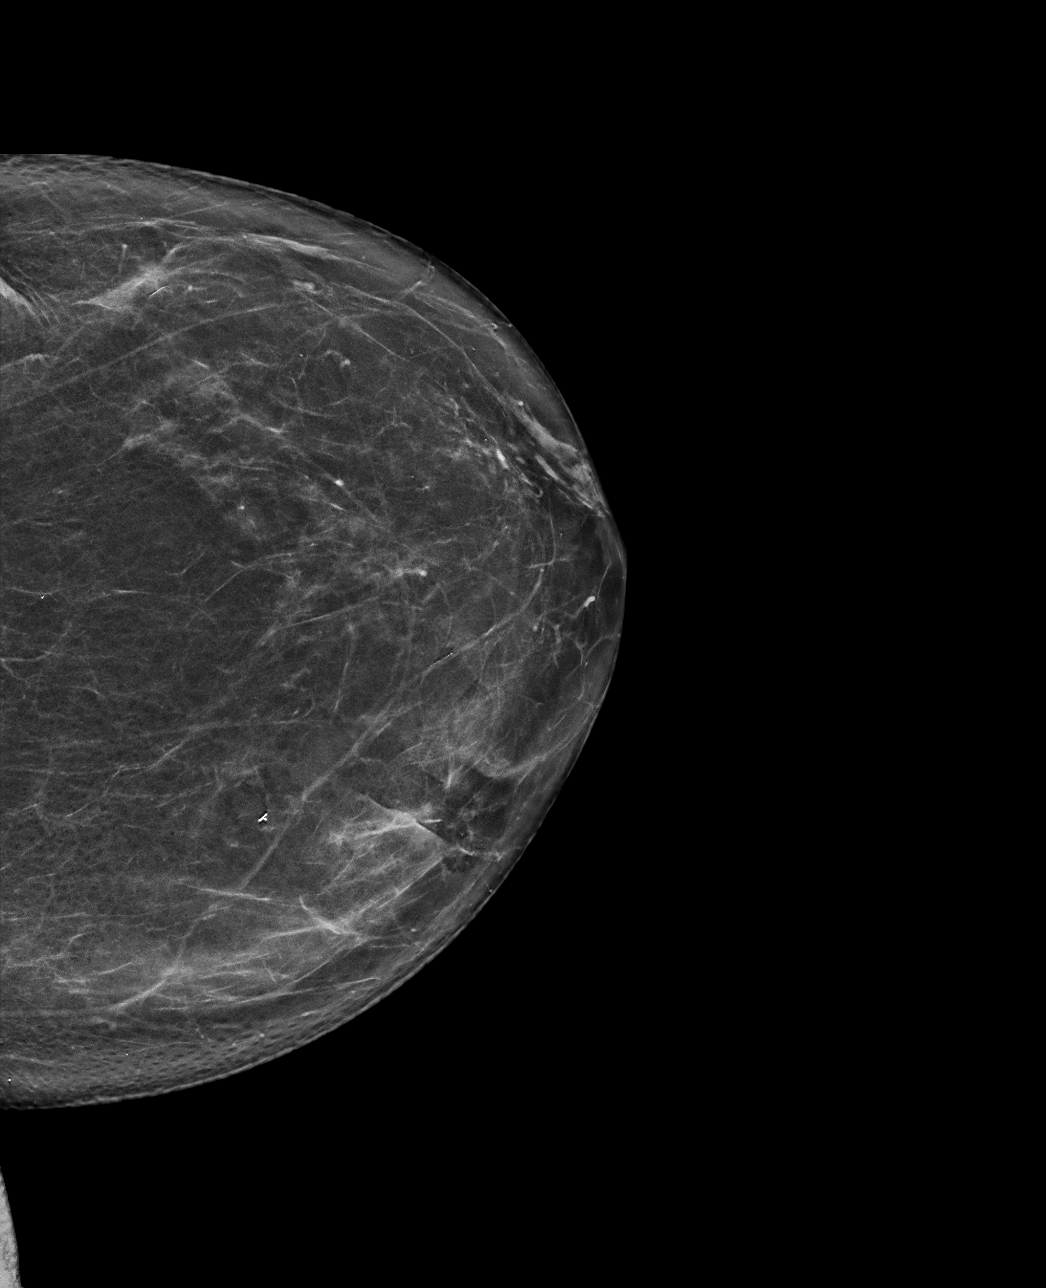

[L ML tomo · tomo slice 39/78.0]
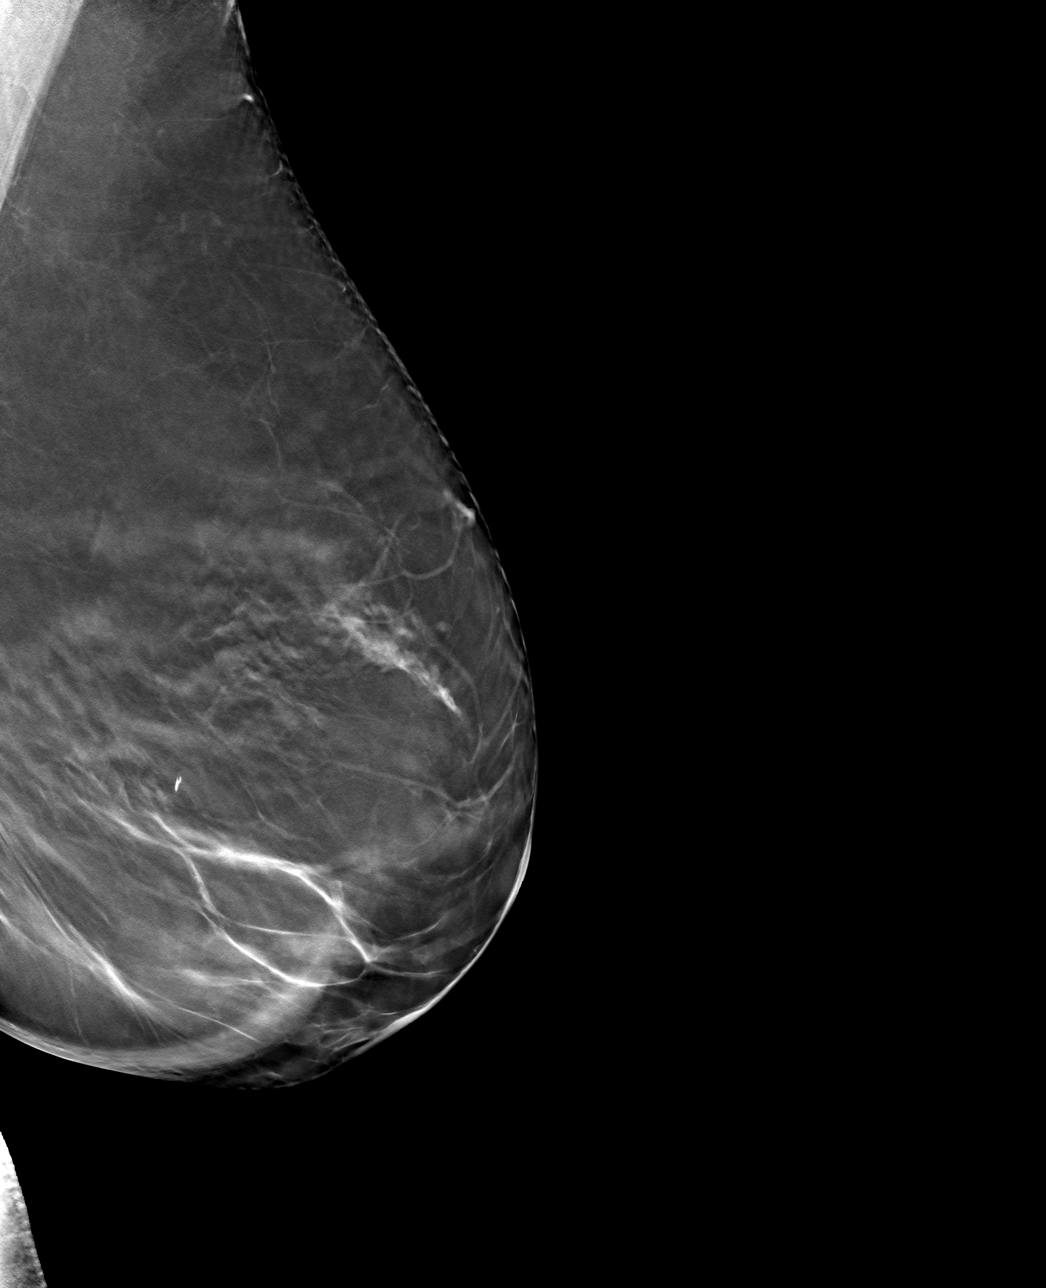

[L CC tomo · tomo slice 36/71.0]
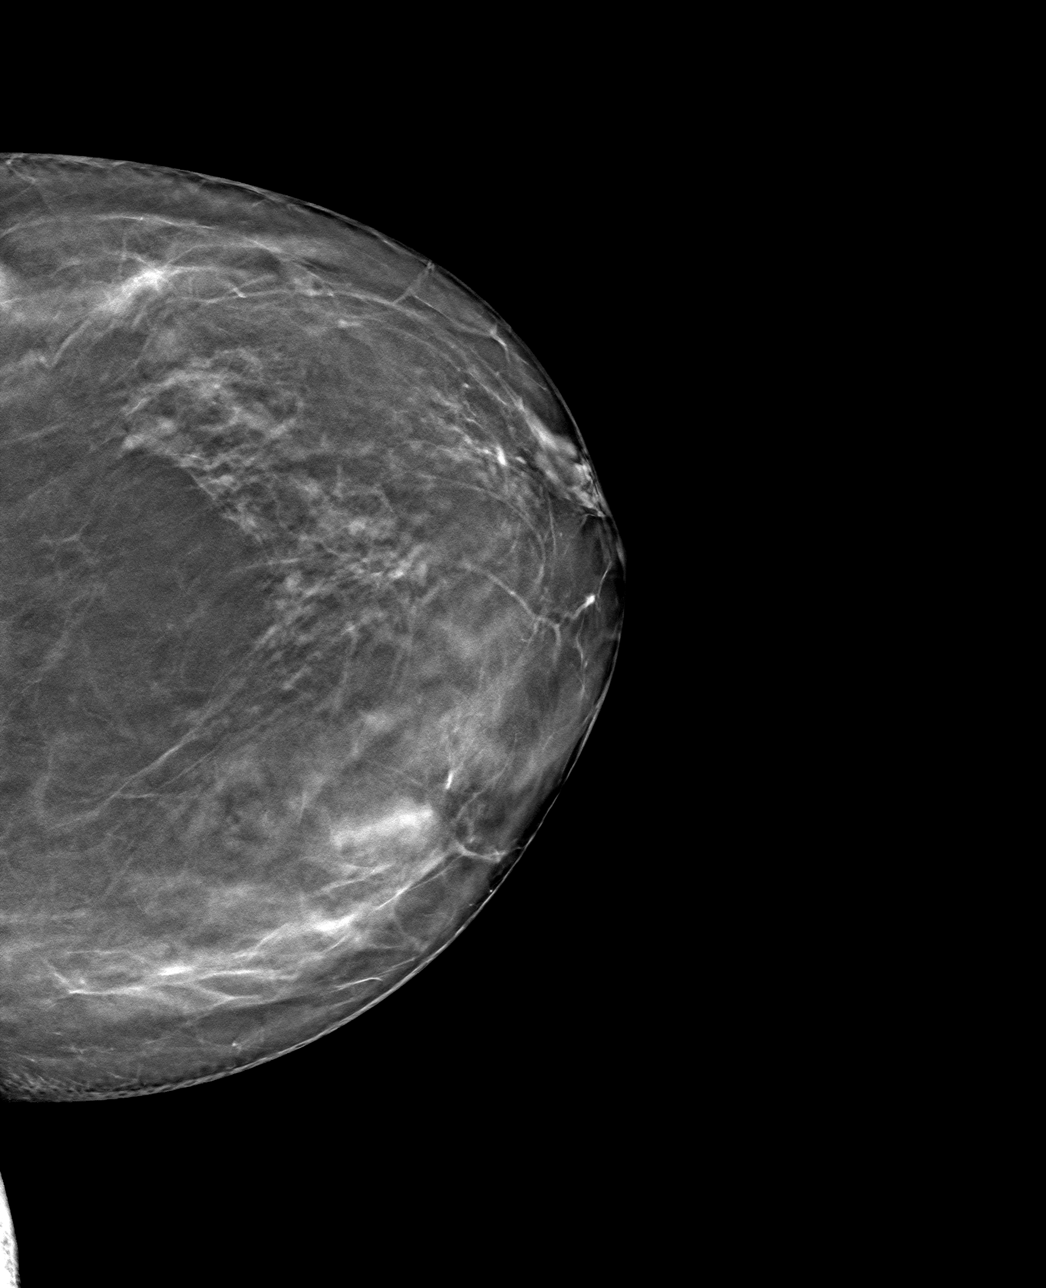

[4 of 12 positions shown; findings below may reference images not displayed]

FINDINGS: Mammographic images were obtained following ultrasound guided biopsy
of left breast. A ribbon shaped clip is identified in the central
medial aspect at middle depth. This is in the expected location.
IMPRESSION: Post biopsy clip in the expected location.

Final Assessment: Post Procedure Mammograms for Marker Placement

## 2020-06-22 DIAGNOSIS — Z20822 Contact with and (suspected) exposure to covid-19: Secondary | ICD-10-CM | POA: Diagnosis not present

## 2020-08-13 ENCOUNTER — Other Ambulatory Visit: Payer: Self-pay | Admitting: Family Medicine

## 2020-08-31 ENCOUNTER — Encounter: Payer: Medicare Other | Admitting: Family Medicine

## 2020-11-14 ENCOUNTER — Ambulatory Visit (INDEPENDENT_AMBULATORY_CARE_PROVIDER_SITE_OTHER): Payer: 59 | Admitting: Family Medicine

## 2020-11-14 ENCOUNTER — Encounter: Payer: Self-pay | Admitting: Family Medicine

## 2020-11-14 ENCOUNTER — Other Ambulatory Visit: Payer: Self-pay

## 2020-11-14 VITALS — BP 120/64 | HR 76 | Temp 97.9°F | Resp 14 | Ht 61.0 in | Wt 173.0 lb

## 2020-11-14 DIAGNOSIS — Z0001 Encounter for general adult medical examination with abnormal findings: Secondary | ICD-10-CM | POA: Diagnosis not present

## 2020-11-14 DIAGNOSIS — M8589 Other specified disorders of bone density and structure, multiple sites: Secondary | ICD-10-CM

## 2020-11-14 DIAGNOSIS — E785 Hyperlipidemia, unspecified: Secondary | ICD-10-CM

## 2020-11-14 DIAGNOSIS — F418 Other specified anxiety disorders: Secondary | ICD-10-CM

## 2020-11-14 DIAGNOSIS — I10 Essential (primary) hypertension: Secondary | ICD-10-CM

## 2020-11-14 DIAGNOSIS — Z0000 Encounter for general adult medical examination without abnormal findings: Secondary | ICD-10-CM

## 2020-11-14 NOTE — Patient Instructions (Signed)
F/u 1 YEAR FOR PHYSICAL

## 2020-11-14 NOTE — Progress Notes (Signed)
Subjective:   Patient presents for Medicare Annual/Subsequent preventive examination.   HTN- taking HCTZ as prescribed no side effects with the medication  MDD/ANXIETY- Prozac 10mg  once a day , she is doing well, no concerns   Had back surgery with Dr. Vertell Limber ,in past, had  did well overall, she did tweak her back a month ago, trying to get her dog out of a tight space, used ice NSAIDS, now resolved    Mammogram done in June of this year     Oct 27th had colonoscopy - Dr. Oletta Lamas- had 2 polyps    Risk Factors  Current exercise habits: walks Dietary issues discussed: No major concerns  Cardiac risk factors: Obesity (BMI >= 30 kg/m2).  Mild hyperlipidemia, hypertension  Depression Screen  (Note: if answer to either of the following is "Yes", a more complete depression screening is indicated)  Over the past two weeks, have you felt down, depressed or hopeless? No Over the past two weeks, have you felt little interest or pleasure in doing things? No Have you lost interest or pleasure in daily life? No Do you often feel hopeless? No Do you cry easily over simple problems? No   Activities of Daily Living  In your present state of health, do you have any difficulty performing the following activities?:  Driving? No  Managing money? No  Feeding yourself? No  Getting from bed to chair? No  Climbing a flight of stairs? No  Preparing food and eating?: No  Bathing or showering? No  Getting dressed: No  Getting to the toilet? No  Using the toilet:No  Moving around from place to place: No  In the past year have you fallen or had a near fall?:No    Hearing Difficulties: No  Do you often ask people to speak up or repeat themselves? No  Do you experience ringing or noises in your ears? No Do you have difficulty understanding soft or whispered voices? No  Do you feel that you have a problem with memory? No Do you often misplace items? No  Do you feel safe at home?  Yes  Cognitive Testing  Alert? Yes Normal Appearance?Yes  Oriented to person? Yes Place? Yes  Time? Yes  Recall of three objects? Yes  Can perform simple calculations? Yes  Displays appropriate judgment?Yes  Can read the correct time from a watch face?Yes   List the Names of Other Physician/Practitioners you currently use:   GYN- Dr. Philis Pique  Neurosurgery- Dr. Vertell Limber  GI- Dr. [facility]  Dentist  Dermatology- Dr. Delman Cheadle   Screening Tests / Date ColonoscopyOct 2020, had polyps  Zostavax- UTD Mammogram- UTD at GYN  Bone Density- Done 2020 Influenza VaccineUTD Tetanus/tdapUTD Pneumonia-UTD  ROS: GEN- denies fatigue, fever, weight loss,weakness, recent illness HEENT- denies eye drainage, change in vision, nasal discharge, CVS- denies chest pain, palpitations RESP- denies SOB, cough, wheeze ABD- denies N/V, change in stools, abd pain GU- denies dysuria, hematuria, dribbling, incontinence MSK- denies joint pain, muscle aches, injury Neuro- denies headache, dizziness, syncope, seizure activity  PHYSICAL: vitals reviewed  GEN- NAD, alert and oriented x3 HEENT- PERRL, EOMI, non injected sclera, pink conjunctiva, , TM clear bilat no effusion Neck- Supple, no thryomegaly, no bruit  CVS- RRR, no murmur RESP-CTAB ABD-NABS,soft,ND,NT Psych-normal affect and mood  EXT- No edema Pulses- Radial, DP- 2+    Assessment:    Annual wellness medicare exam   Plan:    During the course of the visit the patient  was educated and counseled about appropriate screening and preventive services including:   Bone Density- due in Fall of 2022  MDD- continue prozac at bedtime   Fasting labs reviewed  HTN- well controlled, no changes  Mild hyperlipidemia improved with dietary changes.  Continue with exercise.   full code   immunizations up-to-date   Fall/depression/audit C negative    Diet review for nutrition referral?  Yes ____ Not Indicated __x__  Patient Instructions (the written plan) was given to the patient.  Medicare Attestation  I have personally reviewed:  The patient's medical and social history  Their use of alcohol, tobacco or illicit drugs  Their current medications and supplements  The patient's functional ability including ADLs,fall risks, home safety risks, cognitive, and hearing and visual impairment  Diet and physical activities  Evidence for depression or mood disorders  The patient's weight, height, BMI, and visual acuity have been recorded in the chart. I have made referrals, counseling, and provided education to the patient based on review of the above and I have provided the patient with a written personalized care plan for preventive services.

## 2020-11-15 LAB — COMPREHENSIVE METABOLIC PANEL
AG Ratio: 1.7 (calc) (ref 1.0–2.5)
ALT: 13 U/L (ref 6–29)
AST: 20 U/L (ref 10–35)
Albumin: 4.5 g/dL (ref 3.6–5.1)
Alkaline phosphatase (APISO): 74 U/L (ref 37–153)
BUN: 13 mg/dL (ref 7–25)
CO2: 33 mmol/L — ABNORMAL HIGH (ref 20–32)
Calcium: 10 mg/dL (ref 8.6–10.4)
Chloride: 98 mmol/L (ref 98–110)
Creat: 0.8 mg/dL (ref 0.50–0.99)
Globulin: 2.6 g/dL (calc) (ref 1.9–3.7)
Glucose, Bld: 86 mg/dL (ref 65–99)
Potassium: 3.9 mmol/L (ref 3.5–5.3)
Sodium: 138 mmol/L (ref 135–146)
Total Bilirubin: 0.7 mg/dL (ref 0.2–1.2)
Total Protein: 7.1 g/dL (ref 6.1–8.1)

## 2020-11-15 LAB — CBC WITH DIFFERENTIAL/PLATELET
Absolute Monocytes: 578 cells/uL (ref 200–950)
Basophils Absolute: 80 cells/uL (ref 0–200)
Basophils Relative: 1.5 %
Eosinophils Absolute: 127 cells/uL (ref 15–500)
Eosinophils Relative: 2.4 %
HCT: 42 % (ref 35.0–45.0)
Hemoglobin: 14 g/dL (ref 11.7–15.5)
Lymphs Abs: 1977 cells/uL (ref 850–3900)
MCH: 30.2 pg (ref 27.0–33.0)
MCHC: 33.3 g/dL (ref 32.0–36.0)
MCV: 90.5 fL (ref 80.0–100.0)
MPV: 11 fL (ref 7.5–12.5)
Monocytes Relative: 10.9 %
Neutro Abs: 2539 cells/uL (ref 1500–7800)
Neutrophils Relative %: 47.9 %
Platelets: 272 10*3/uL (ref 140–400)
RBC: 4.64 10*6/uL (ref 3.80–5.10)
RDW: 12.4 % (ref 11.0–15.0)
Total Lymphocyte: 37.3 %
WBC: 5.3 10*3/uL (ref 3.8–10.8)

## 2020-11-15 LAB — LIPID PANEL
Cholesterol: 217 mg/dL — ABNORMAL HIGH (ref <200)
HDL: 60 mg/dL (ref >=50)
LDL Cholesterol (Calc): 137 mg/dL (calc) — ABNORMAL HIGH
Non-HDL Cholesterol (Calc): 157 mg/dL (calc) — ABNORMAL HIGH (ref <130)
Total CHOL/HDL Ratio: 3.6 (calc) (ref <5.0)
Triglycerides: 94 mg/dL (ref <150)

## 2023-10-11 ENCOUNTER — Encounter: Payer: Self-pay | Admitting: Emergency Medicine

## 2023-10-11 ENCOUNTER — Ambulatory Visit
Admission: EM | Admit: 2023-10-11 | Discharge: 2023-10-11 | Disposition: A | Discharge status: Home / Self Care | Payer: Medicare Other | Attending: Nurse Practitioner | Admitting: Nurse Practitioner

## 2023-10-11 DIAGNOSIS — J0100 Acute maxillary sinusitis, unspecified: Secondary | ICD-10-CM | POA: Diagnosis not present

## 2023-10-11 MED ORDER — FLUTICASONE PROPIONATE 50 MCG/ACT NA SUSP: 2.0000 | Freq: Every day | NASAL | 0 refills | Status: AC

## 2023-10-11 MED ORDER — AMOXICILLIN-POT CLAVULANATE 875-125 MG PO TABS: 1.0000 | ORAL_TABLET | Freq: Two times a day (BID) | ORAL | 0 refills | Status: AC

## 2023-10-11 NOTE — Discharge Instructions (Addendum)
Take medication as directed. Continue cetirizine at this time. Increase fluids and get plenty of rest. May take over-the-counter Tylenol as needed for pain, fever, or general discomfort. Recommend normal saline nasal spray to help with nasal congestion throughout the day. For your cough, it may be helpful to use a humidifier at bedtime during sleep. If symptoms do not improve with this treatment, please follow-up with your primary care physician for further evaluation. Follow-up as needed.

## 2023-10-11 NOTE — ED Provider Notes (Signed)
[facility]    CSN: [id number] Arrival date & time: 10/11/23  0940      History   Chief Complaint No chief complaint on file.   HPI Laura [name] is a 72 y.o. female.   The history is provided by the patient.   Patient with a 1 week history of headache, nasal congestion, sinus pressure, and productive cough.  Patient denies fever, chills, ear drainage, wheezing, difficulty breathing, chest pain, abdominal pain, nausea, vomiting, diarrhea, or rash.  Patient states initially she thought symptoms were improving, but over the past 24 hours, she states she feels worse.  She states that cough worsened over the past 24 hours.  Patient reports she has been taking over-the-counter Mucinex and NyQuil with minimal relief.  Reports that she also started cetirizine with minimal relief.  Past Medical History:  Diagnosis Date   Chest pain    Depression    Hypertension    Normal cardiac stress test     Patient Active Problem List   Diagnosis Date Noted   Osteopenia 09/13/2019   Mild hyperlipidemia 08/31/2019   Depression with anxiety 07/17/2015   Essential hypertension 05/10/2014    Past Surgical History:  Procedure Laterality Date   SPINE SURGERY     herniated disc    OB History   No obstetric history on file.      Home Medications    Prior to Admission medications   Medication Sig Start Date End Date Taking? Authorizing Provider  amoxicillin-clavulanate (AUGMENTIN) 875-125 MG tablet Take 1 tablet by mouth every 12 (twelve) hours. 10/11/23  Yes Leath-Warren, Sadie Haber, NP  fluticasone (FLONASE) 50 MCG/ACT nasal spray Place 2 sprays into both nostrils daily. 10/11/23  Yes Leath-Warren, Sadie Haber, NP  Calcium Carbonate-Vit D-Min (CALTRATE 600+D PLUS MINERALS) 600-800 MG-UNIT TABS Take by mouth.    [provider]  hydrochlorothiazide (HYDRODIURIL) 50 MG tablet TAKE 1 TABLET BY MOUTH EVERY DAY 08/13/20   Milton, Velna Hatchet, MD    Family History Family  History  Problem Relation Age of Onset   Cancer Mother    Hypertension Mother    Alcohol abuse Father    Cancer Father        Pancreatic   Diabetes Father    Hypertension Brother    Depression Sister    Mental illness Sister     Social History Social History   Tobacco Use   Smoking status: Never   Smokeless tobacco: Never  Substance Use Topics   Alcohol use: Yes    Comment: occasionally   Drug use: No     Allergies   Oxycodone   Review of Systems Review of Systems Per HPI  Physical Exam Triage Vital Signs ED Triage Vitals  Encounter Vitals Group     BP 10/11/23 1006 (!) 145/77     Systolic BP Percentile --      Diastolic BP Percentile --      Pulse Rate 10/11/23 1006 79     Resp 10/11/23 1006 18     Temp 10/11/23 1006 98.7 F (37.1 C)     Temp Source 10/11/23 1006 Oral     SpO2 10/11/23 1006 94 %     Weight --      Height --      Head Circumference --      Peak Flow --      Pain Score 10/11/23 1008 5     Pain Loc --      Pain Education --  Exclude from Growth Chart --    No data found.  Updated Vital Signs BP (!) 145/77 (BP Location: Right Arm)   Pulse 79   Temp 98.7 F (37.1 C) (Oral)   Resp 18   SpO2 94%   Visual Acuity Right Eye Distance:   Left Eye Distance:   Bilateral Distance:    Right Eye Near:   Left Eye Near:    Bilateral Near:     Physical Exam Vitals and nursing note reviewed.  Constitutional:      General: She is not in acute distress.    Appearance: Normal appearance.  HENT:     Head: Normocephalic.     Right Ear: Tympanic membrane, ear canal and external ear normal.     Left Ear: Tympanic membrane, ear canal and external ear normal.     Nose: Congestion present.     Right Turbinates: Enlarged and swollen.     Left Turbinates: Enlarged and swollen.     Right Sinus: Maxillary sinus tenderness present. No frontal sinus tenderness.     Left Sinus: Maxillary sinus tenderness present. No frontal sinus tenderness.      Mouth/Throat:     Lips: Pink.     Mouth: Mucous membranes are moist.     Pharynx: Oropharynx is clear. Uvula midline. Postnasal drip present. No pharyngeal swelling, oropharyngeal exudate, posterior oropharyngeal erythema or uvula swelling.  Eyes:     Extraocular Movements: Extraocular movements intact.     Conjunctiva/sclera: Conjunctivae normal.     Pupils: Pupils are equal, round, and reactive to light.  Cardiovascular:     Rate and Rhythm: Normal rate and regular rhythm.     Pulses: Normal pulses.     Heart sounds: Normal heart sounds.  Pulmonary:     Effort: Pulmonary effort is normal. No respiratory distress.     Breath sounds: Normal breath sounds. No stridor. No wheezing, rhonchi or rales.  Abdominal:     General: Bowel sounds are normal.     Palpations: Abdomen is soft.     Tenderness: There is no abdominal tenderness.  Musculoskeletal:     Cervical back: Normal range of motion.  Lymphadenopathy:     Cervical: No cervical adenopathy.  Skin:    General: Skin is warm and dry.  Neurological:     General: No focal deficit present.     Mental Status: She is alert and oriented to person, place, and time.  Psychiatric:        Mood and Affect: Mood normal.        Behavior: Behavior normal.      UC Treatments / Results  Labs (all labs ordered are listed, but only abnormal results are displayed) Labs Reviewed - No data to display  EKG   Radiology No results found.  Procedures Procedures (including critical care time)  Medications Ordered in UC Medications - No data to display  Initial Impression / Assessment and Plan / UC Course  I have reviewed the triage vital signs and the nursing notes.  Pertinent labs & imaging results that were available during my care of the patient were reviewed by me and considered in my medical decision making (see chart for details).  On exam, lung sounds are clear throughout, room air sats at 94%, no indication for imaging at this  time.  She does have moderate maxillary sinus tenderness and pressure, with what appears to be rebound symptoms.  Symptoms consistent with acute maxillary sinusitis.  Will treat with Augmentin  875/125 mg for the next 7 days along with fluticasone 50 mcg nasal spray.  Supportive care recommendations were provided and discussed with the patient to include fluids, rest, over-the-counter Tylenol, normal saline nasal spray, and use of a humidifier during sleep.  Discussed indications with the patient regarding when follow-up be indicated.  Patient is in agreement with this plan of care and verbalizes understanding.  All questions were answered.  Patient stable for discharge.  Final Clinical Impressions(s) / UC Diagnoses   Final diagnoses:  Acute maxillary sinusitis, recurrence not specified     Discharge Instructions      Take medication as directed. Continue cetirizine at this time. Increase fluids and get plenty of rest. May take over-the-counter Tylenol as needed for pain, fever, or general discomfort. Recommend normal saline nasal spray to help with nasal congestion throughout the day. For your cough, it may be helpful to use a humidifier at bedtime during sleep. If symptoms do not improve with this treatment, please follow-up with your primary care physician for further evaluation. Follow-up as needed.     ED Prescriptions     Medication Sig Dispense Auth. Provider   amoxicillin-clavulanate (AUGMENTIN) 875-125 MG tablet Take 1 tablet by mouth every 12 (twelve) hours. 14 tablet Leath-Warren, Sadie Haber, NP   fluticasone (FLONASE) 50 MCG/ACT nasal spray Place 2 sprays into both nostrils daily. 16 g Leath-Warren, Sadie Haber, NP      PDMP not reviewed this encounter.   Abran Cantor, NP 10/11/23 1135

## 2023-10-11 NOTE — ED Triage Notes (Signed)
Productive Cough x 1 week.   has been taking mucinex and nyquil without relief.

## 2023-11-25 ENCOUNTER — Other Ambulatory Visit (HOSPITAL_COMMUNITY): Payer: Self-pay

## 2023-11-25 MED ORDER — OSELTAMIVIR PHOSPHATE 75 MG PO CAPS
75.0000 mg | ORAL_CAPSULE | Freq: Two times a day (BID) | ORAL | 0 refills | Status: AC
  Filled 2023-11-25: qty 10, 5d supply, fill #0

## 2023-11-26 ENCOUNTER — Other Ambulatory Visit (HOSPITAL_COMMUNITY): Payer: Self-pay

## 2023-11-27 ENCOUNTER — Other Ambulatory Visit (HOSPITAL_COMMUNITY): Payer: Self-pay

## 2023-12-09 ENCOUNTER — Other Ambulatory Visit (HOSPITAL_COMMUNITY): Payer: Self-pay | Admitting: Nurse Practitioner

## 2023-12-09 DIAGNOSIS — M8580 Other specified disorders of bone density and structure, unspecified site: Secondary | ICD-10-CM

## 2023-12-17 ENCOUNTER — Ambulatory Visit (HOSPITAL_COMMUNITY)
Admission: RE | Admit: 2023-12-17 | Discharge: 2023-12-17 | Disposition: A | Discharge status: Home / Self Care | Payer: Medicare Other | Source: Ambulatory Visit | Attending: Nurse Practitioner | Admitting: Nurse Practitioner

## 2023-12-17 DIAGNOSIS — M8589 Other specified disorders of bone density and structure, multiple sites: Secondary | ICD-10-CM | POA: Diagnosis not present

## 2023-12-17 DIAGNOSIS — Z13820 Encounter for screening for osteoporosis: Secondary | ICD-10-CM | POA: Insufficient documentation

## 2023-12-17 DIAGNOSIS — Z780 Asymptomatic menopausal state: Secondary | ICD-10-CM | POA: Insufficient documentation

## 2023-12-17 DIAGNOSIS — M8580 Other specified disorders of bone density and structure, unspecified site: Secondary | ICD-10-CM
